# Patient Record
Sex: Female | Born: 1940
Health system: Southern US, Community
[De-identification: ages and names within clinical notes are randomized; demographics above are authoritative.]

## PROBLEM LIST (undated history)

## (undated) DIAGNOSIS — E785 Hyperlipidemia, unspecified: Secondary | ICD-10-CM

## (undated) DIAGNOSIS — T7840XA Allergy, unspecified, initial encounter: Secondary | ICD-10-CM

## (undated) DIAGNOSIS — M1990 Unspecified osteoarthritis, unspecified site: Secondary | ICD-10-CM

## (undated) HISTORY — PX: COLONOSCOPY: SHX174

## (undated) HISTORY — PX: TUBAL LIGATION: SHX77

## (undated) HISTORY — PX: UPPER GI ENDOSCOPY: SHX6162

## (undated) HISTORY — DX: Allergy, unspecified, initial encounter: T78.40XA

## (undated) HISTORY — PX: BREAST CYST ASPIRATION: SHX578

## (undated) HISTORY — PX: TONSILLECTOMY: SUR1361

---

## 1999-08-02 ENCOUNTER — Encounter: Payer: Self-pay | Admitting: Family Medicine

## 1999-08-02 ENCOUNTER — Encounter: Admission: RE | Admit: 1999-08-02 | Discharge: 1999-08-02 | Payer: Self-pay | Admitting: Family Medicine

## 2000-07-02 ENCOUNTER — Other Ambulatory Visit: Admission: RE | Admit: 2000-07-02 | Discharge: 2000-07-02 | Payer: Self-pay | Admitting: Family Medicine

## 2000-08-02 ENCOUNTER — Encounter: Admission: RE | Admit: 2000-08-02 | Discharge: 2000-08-02 | Payer: Self-pay | Admitting: Family Medicine

## 2000-08-02 ENCOUNTER — Encounter: Payer: Self-pay | Admitting: Family Medicine

## 2000-10-05 ENCOUNTER — Encounter: Payer: Self-pay | Admitting: Family Medicine

## 2000-10-05 ENCOUNTER — Encounter: Admission: RE | Admit: 2000-10-05 | Discharge: 2000-10-05 | Payer: Self-pay | Admitting: Family Medicine

## 2000-11-07 ENCOUNTER — Ambulatory Visit (HOSPITAL_COMMUNITY): Admission: RE | Admit: 2000-11-07 | Discharge: 2000-11-07 | Payer: Self-pay | Admitting: Gastroenterology

## 2000-11-07 ENCOUNTER — Encounter (INDEPENDENT_AMBULATORY_CARE_PROVIDER_SITE_OTHER): Payer: Self-pay | Admitting: Specialist

## 2002-01-20 ENCOUNTER — Encounter: Payer: Self-pay | Admitting: Gastroenterology

## 2002-01-20 ENCOUNTER — Encounter: Admission: RE | Admit: 2002-01-20 | Discharge: 2002-01-20 | Payer: Self-pay | Admitting: Gastroenterology

## 2002-02-06 ENCOUNTER — Encounter: Admission: RE | Admit: 2002-02-06 | Discharge: 2002-02-06 | Payer: Self-pay | Admitting: Gastroenterology

## 2002-02-06 ENCOUNTER — Encounter: Payer: Self-pay | Admitting: Gastroenterology

## 2002-03-13 ENCOUNTER — Encounter: Payer: Self-pay | Admitting: Gastroenterology

## 2002-03-13 ENCOUNTER — Ambulatory Visit (HOSPITAL_COMMUNITY): Admission: RE | Admit: 2002-03-13 | Discharge: 2002-03-13 | Payer: Self-pay | Admitting: Gastroenterology

## 2002-07-09 ENCOUNTER — Ambulatory Visit (HOSPITAL_COMMUNITY): Admission: RE | Admit: 2002-07-09 | Discharge: 2002-07-09 | Payer: Self-pay | Admitting: Gastroenterology

## 2002-08-15 ENCOUNTER — Encounter: Payer: Self-pay | Admitting: Family Medicine

## 2002-08-15 ENCOUNTER — Encounter: Admission: RE | Admit: 2002-08-15 | Discharge: 2002-08-15 | Payer: Self-pay | Admitting: Family Medicine

## 2002-09-26 ENCOUNTER — Encounter: Payer: Self-pay | Admitting: Family Medicine

## 2002-09-26 ENCOUNTER — Encounter: Admission: RE | Admit: 2002-09-26 | Discharge: 2002-09-26 | Payer: Self-pay | Admitting: Family Medicine

## 2003-05-13 ENCOUNTER — Encounter (INDEPENDENT_AMBULATORY_CARE_PROVIDER_SITE_OTHER): Payer: Self-pay | Admitting: *Deleted

## 2003-05-13 ENCOUNTER — Ambulatory Visit (HOSPITAL_COMMUNITY): Admission: RE | Admit: 2003-05-13 | Discharge: 2003-05-13 | Payer: Self-pay | Admitting: Gastroenterology

## 2003-10-20 ENCOUNTER — Encounter: Admission: RE | Admit: 2003-10-20 | Discharge: 2003-10-20 | Payer: Self-pay | Admitting: Family Medicine

## 2004-09-22 ENCOUNTER — Other Ambulatory Visit: Admission: RE | Admit: 2004-09-22 | Discharge: 2004-09-22 | Payer: Self-pay | Admitting: Family Medicine

## 2004-10-25 ENCOUNTER — Encounter: Admission: RE | Admit: 2004-10-25 | Discharge: 2004-10-25 | Payer: Self-pay | Admitting: Family Medicine

## 2005-11-06 ENCOUNTER — Encounter: Admission: RE | Admit: 2005-11-06 | Discharge: 2005-11-06 | Payer: Self-pay | Admitting: Family Medicine

## 2005-12-05 ENCOUNTER — Other Ambulatory Visit: Admission: RE | Admit: 2005-12-05 | Discharge: 2005-12-05 | Payer: Self-pay | Admitting: Family Medicine

## 2006-11-07 ENCOUNTER — Encounter: Admission: RE | Admit: 2006-11-07 | Discharge: 2006-11-07 | Payer: Self-pay | Admitting: Family Medicine

## 2006-12-07 ENCOUNTER — Other Ambulatory Visit: Admission: RE | Admit: 2006-12-07 | Discharge: 2006-12-07 | Payer: Self-pay | Admitting: Family Medicine

## 2007-10-09 ENCOUNTER — Encounter: Admission: RE | Admit: 2007-10-09 | Discharge: 2007-10-09 | Payer: Self-pay | Admitting: Family Medicine

## 2007-10-18 ENCOUNTER — Encounter: Admission: RE | Admit: 2007-10-18 | Discharge: 2007-10-18 | Payer: Self-pay | Admitting: Family Medicine

## 2007-11-13 ENCOUNTER — Encounter: Admission: RE | Admit: 2007-11-13 | Discharge: 2007-11-13 | Payer: Self-pay | Admitting: Family Medicine

## 2007-12-09 ENCOUNTER — Other Ambulatory Visit: Admission: RE | Admit: 2007-12-09 | Discharge: 2007-12-09 | Payer: Self-pay | Admitting: Family Medicine

## 2008-11-17 ENCOUNTER — Encounter: Admission: RE | Admit: 2008-11-17 | Discharge: 2008-11-17 | Payer: Self-pay | Admitting: Family Medicine

## 2009-11-25 ENCOUNTER — Encounter: Admission: RE | Admit: 2009-11-25 | Discharge: 2009-11-25 | Payer: Self-pay | Admitting: Family Medicine

## 2010-01-28 ENCOUNTER — Other Ambulatory Visit: Admission: RE | Admit: 2010-01-28 | Discharge: 2010-01-28 | Payer: Self-pay | Admitting: Family Medicine

## 2010-10-23 ENCOUNTER — Encounter: Payer: Self-pay | Admitting: Family Medicine

## 2010-12-05 ENCOUNTER — Other Ambulatory Visit: Payer: Self-pay | Admitting: Family Medicine

## 2010-12-05 DIAGNOSIS — Z1231 Encounter for screening mammogram for malignant neoplasm of breast: Secondary | ICD-10-CM

## 2010-12-07 ENCOUNTER — Ambulatory Visit
Admission: RE | Admit: 2010-12-07 | Discharge: 2010-12-07 | Disposition: A | Discharge status: Home / Self Care | Payer: 59 | Source: Ambulatory Visit | Attending: Family Medicine | Admitting: Family Medicine

## 2010-12-07 DIAGNOSIS — Z1231 Encounter for screening mammogram for malignant neoplasm of breast: Secondary | ICD-10-CM

## 2011-02-17 NOTE — Procedures (Signed)
[place]  Patient:    Samantha Joseph, Samantha Joseph                     MRN: [id number] Proc. Date: 11/07/00 Adm. Date:  [id number] Attending:  Nelda Marseille CC:         Gretta Arab. Valentina Lucks, M.D.   Procedure Report  PROCEDURE:  Esophagogastroduodenoscopy with biopsy.  ENDOSCOPIST:  Petra Kuba, M.D.  INDICATIONS:  Patient with recurrent episodes of abdominal pain questionably due to Helicobacter.  INFORMED CONSENT:  Consent was signed after risk, benefits, methods and options were thoroughly discussed in the office.  MEDICINES USED:  Demerol 50 mg, Versed 5 mg.  DESCRIPTION OF PROCEDURE:  The video endoscope was inserted by direct vision. The esophagus was normal.  The scope was inserted into the stomach where some minimal antritis and minimal gastritis along the lesser curve was seen. The scope passed easily through the normal pylorus into a normal duodenal bulb and around the C. loop to a normal second portion of the duodenum.  The scope was withdrawn back to the bulb and a good look there ruled out abnormalities in that location.  The scope was withdrawn back to the stomach and retroflexed. Cardiac, angularis, fundus, lesser and greater curve were all normal except for the area of the lesser curve with some gastritis.  The scope was straightened and straight visualization in the stomach confirmed the above.  The scope was then advanced to the antrum in the lesser curve and multiple biopsies were obtained of those areas of gastritis as well as a few proximal gastric biopsies to rule out H. pylori.  Air was suctioned after the scope was reinserted into the duodenum to recheck that area and then the scope was slowly withdrawn through a normal esophagus.  Scope was withdrawn.  The patient tolerated the procedure well and there was no obvious immediate complication.  ENDOSCOPIC DIAGNOSIS: 1. Minimal antritis and lesser curve gastritis status  post biopsy. 2. Otherwise within normal limits EGD.  PLAN: 1. Await pathology, continue Prilosec. 2. See back p.r.n. or in 2-3 months. DD:  11/07/00 TD:  11/08/00 Job: [id number]

## 2011-02-17 NOTE — Op Note (Signed)
   NAME:  Samantha Joseph, Samantha Joseph                        ACCOUNT NO.:  [id number]   MEDICAL RECORD NO.:  [id number]                   PATIENT TYPE:  AMB   LOCATION:  ENDO                                 FACILITY:  [facility]   PHYSICIAN:  Anselmo Rod, M.D.               DATE OF BIRTH:  01-30-1941   DATE OF PROCEDURE:  05/13/2003  DATE OF DISCHARGE:                                 OPERATIVE REPORT   PROCEDURE:  Esophagogastroduodenoscopy with biopsy.   ENDOSCOPIST:  Charna Elizabeth, M.D.   INSTRUMENT USED:  Olympus video panendoscope.   INDICATIONS FOR PROCEDURE:  70 year old white female with a history of  ongoing epigastric pain and chest pain with a history of previous H. pylori  infections in the past, rule out peptic ulcer disease, esophagitis,  gastritis, etc.   PREPROCEDURE PREPARATION:  Informed consent was obtained from the patient.  The patient was fasted for eight hours prior to the procedure.   PREPROCEDURE PHYSICAL:  Patient with stable vital signs.  Neck supple.  Chest clear to auscultation.  S1 and S2 regular.  Abdomen soft with normal  bowel sounds.   DESCRIPTION OF PROCEDURE:  The patient was placed in the left lateral  decubitus position, sedated with 40 mg of Demerol and 4 mg Versed  intravenously.  Once the patient was adequately sedated, maintained on low  flow oxygen, continuous cardiac monitoring, the Olympus video panendoscope  was advanced through the mouth piece over the tongue into the esophagus  under direct vision.  The entire esophagus appeared normal with no evidence  of ring, strictures, masses, esophagitis, or Barrett's mucosa.  The scope  was then advanced into the stomach.  There was moderate diffuse gastritis.  No ulcers, erosions, varices or polyps were seen.  Antral biopsies were done  to rule out H. pylori as etiology.  The duodenum, bulb, and small bowel  appeared normal.  There was no ulcer.  The patient tolerated the procedure  well without  complications.   IMPRESSION:  1. Moderate diffuse gastritis, no ulcer, erosions, masses, or polyps seen.  2. Normal appearing esophagus and proximal small bowel.    RECOMMENDATIONS:  1. Continue PPIs.  2. Treat with antibiotics if H. pylori is present.  3. Outpatient follow up in the next two weeks with further recommendations.                                               Anselmo Rod, M.D.    JNM/MEDQ  D:  05/13/2003  T:  05/13/2003  Job:  [id number]   cc:   Gretta Arab. Valentina Lucks, M.D.  [address] [place]  [place] [address]  Fax: [phone]

## 2011-02-17 NOTE — Op Note (Signed)
   NAME:  Samantha Joseph, Samantha Joseph                        ACCOUNT NO.:  [id number]   MEDICAL RECORD NO.:  [id number]                   PATIENT TYPE:  AMB   LOCATION:  ENDO                                 FACILITY:  [facility]   PHYSICIAN:  Anselmo Rod, M.D.               DATE OF BIRTH:  [birth year]   DATE OF PROCEDURE:  07/09/2002  DATE OF DISCHARGE:                                 OPERATIVE REPORT   PROCEDURE PERFORMED:  Screening colonoscopy.   ENDOSCOPIST:  Anselmo Rod, M.D.   INSTRUMENT USED:  Pediatric adjustable Olympus colonoscope.   INDICATIONS FOR PROCEDURE:  70 year old white female underwent screening  colonoscopy because of a history of colon cancer and rule out colonic  polyps, masses, etc.   PROCEDURE PREPARATION:  Informed consent was procured from the patient.  The  patient was fasted for eight hours prior to the procedure and prepped with a  bottle of magnesium citrate and a gallon of NuLytely the night prior to  procedure.   PREPROCEDURE PHYSICAL:  VITAL SIGNS:  The patient had stable vital signs.  NECK:  Supple.  CHEST:  Clear to auscultation.  S1 and S2 regular.  ABDOMEN:  Soft with normal bowel sounds.   DESCRIPTION OF PROCEDURE:  The patient was placed in the left lateral  decubitus position, sedated with 50 mg of Demerol and 5 mg of Versed  intravenously.  Once the patient was adequately sedated and maintained on  low-flow oxygen and continuous cardiac monitoring, the Olympus video  colonoscope was advanced in the rectum to the cecum without difficulty.  Prominent internal hemorrhoids were seen on retroflexion.  Small external  hemorrhoids were seen on anal inspection.  The rest of the colonic mucosa up  to the cecum appeared normal with no evidence of diverticulosis, masses, or  polyps.  The appendicular orifice and ileocecal orifice were clearly  visualized and photographed.   IMPRESSION:  1. Prominent internal hemorrhoid.  2. Small external hemorrhoid.  3. Normal-appearing colon up to the cecum.   RECOMMENDATIONS:  1. Repeat colorectal cancer screening is recommended in the five years     considering the patient's family history of     colon cancer unless the patient develops any abnormal symptoms.  2. A high-fiber diet with regular fluid intake has been advocated.  3. Outpatient followup on a p.r.n. basis.                                               Anselmo Rod, M.D.    JNM/MEDQ  D:  07/09/2002  T:  07/09/2002  Job:  [id number]   cc:   Gretta Arab. Valentina Lucks, M.D.

## 2011-11-28 ENCOUNTER — Other Ambulatory Visit: Payer: Self-pay | Admitting: Family Medicine

## 2011-11-28 DIAGNOSIS — Z1231 Encounter for screening mammogram for malignant neoplasm of breast: Secondary | ICD-10-CM

## 2011-12-08 ENCOUNTER — Ambulatory Visit
Admission: RE | Admit: 2011-12-08 | Discharge: 2011-12-08 | Disposition: A | Discharge status: Home / Self Care | Payer: 59 | Source: Ambulatory Visit | Attending: Family Medicine | Admitting: Family Medicine

## 2011-12-08 DIAGNOSIS — Z1231 Encounter for screening mammogram for malignant neoplasm of breast: Secondary | ICD-10-CM

## 2012-12-11 ENCOUNTER — Other Ambulatory Visit: Payer: Self-pay

## 2012-12-11 DIAGNOSIS — Z1231 Encounter for screening mammogram for malignant neoplasm of breast: Secondary | ICD-10-CM

## 2012-12-20 ENCOUNTER — Ambulatory Visit
Admission: RE | Admit: 2012-12-20 | Discharge: 2012-12-20 | Disposition: A | Discharge status: Home / Self Care | Payer: 59 | Source: Ambulatory Visit

## 2012-12-20 DIAGNOSIS — Z1231 Encounter for screening mammogram for malignant neoplasm of breast: Secondary | ICD-10-CM

## 2013-05-29 ENCOUNTER — Other Ambulatory Visit: Payer: Self-pay | Admitting: Gastroenterology

## 2013-05-29 DIAGNOSIS — R1031 Right lower quadrant pain: Secondary | ICD-10-CM

## 2013-05-30 ENCOUNTER — Ambulatory Visit
Admission: RE | Admit: 2013-05-30 | Discharge: 2013-05-30 | Disposition: A | Discharge status: Home / Self Care | Payer: Medicare Other | Source: Ambulatory Visit | Attending: Gastroenterology | Admitting: Gastroenterology

## 2013-05-30 DIAGNOSIS — R1031 Right lower quadrant pain: Secondary | ICD-10-CM

## 2013-05-30 MED ORDER — IOHEXOL 300 MG/ML  SOLN
100.0000 mL | Freq: Once | INTRAMUSCULAR | Status: AC | PRN
  Administered 2013-05-30: 100 mL via INTRAVENOUS

## 2013-07-19 ENCOUNTER — Encounter (HOSPITAL_COMMUNITY): Payer: Self-pay | Admitting: Emergency Medicine

## 2013-07-19 ENCOUNTER — Ambulatory Visit (INDEPENDENT_AMBULATORY_CARE_PROVIDER_SITE_OTHER): Payer: Medicare Other | Admitting: Emergency Medicine

## 2013-07-19 ENCOUNTER — Other Ambulatory Visit: Payer: Self-pay

## 2013-07-19 ENCOUNTER — Emergency Department (HOSPITAL_COMMUNITY)
Admission: EM | Admit: 2013-07-19 | Discharge: 2013-07-19 | Disposition: A | Discharge status: Home / Self Care | Payer: 59 | Attending: Emergency Medicine | Admitting: Emergency Medicine

## 2013-07-19 VITALS — BP 122/64 | HR 91 | Temp 98.1°F | Resp 16

## 2013-07-19 DIAGNOSIS — R05 Cough: Secondary | ICD-10-CM

## 2013-07-19 DIAGNOSIS — T7840XA Allergy, unspecified, initial encounter: Secondary | ICD-10-CM

## 2013-07-19 DIAGNOSIS — Z91010 Allergy to peanuts: Secondary | ICD-10-CM | POA: Insufficient documentation

## 2013-07-19 DIAGNOSIS — Z79899 Other long term (current) drug therapy: Secondary | ICD-10-CM | POA: Insufficient documentation

## 2013-07-19 DIAGNOSIS — L509 Urticaria, unspecified: Secondary | ICD-10-CM | POA: Insufficient documentation

## 2013-07-19 DIAGNOSIS — R059 Cough, unspecified: Secondary | ICD-10-CM

## 2013-07-19 MED ORDER — METHYLPREDNISOLONE SODIUM SUCC 125 MG IJ SOLR: 125.0000 mg | Freq: Once | INTRAMUSCULAR | Status: DC

## 2013-07-19 MED ORDER — DIPHENHYDRAMINE HCL 50 MG/ML IJ SOLN: 25.0000 mg | Freq: Once | INTRAMUSCULAR | Status: AC

## 2013-07-19 MED ORDER — EPINEPHRINE 0.3 MG/0.3ML IJ SOAJ
0.3000 mg | Freq: Once | INTRAMUSCULAR | Status: AC
  Administered 2013-07-19 (×2): 0.3 mg via INTRAMUSCULAR

## 2013-07-19 MED ORDER — EPINEPHRINE 0.3 MG/0.3ML IJ SOAJ: 0.3000 mg | INTRAMUSCULAR | Status: DC | PRN

## 2013-07-19 MED ORDER — EPINEPHRINE 0.3 MG/0.3ML IJ SOAJ: 0.3000 mg | Freq: Once | INTRAMUSCULAR | Status: AC

## 2013-07-19 MED ORDER — EPINEPHRINE 0.3 MG/0.3ML IJ SOAJ: 0.3000 mg | Freq: Once | INTRAMUSCULAR | Status: DC

## 2013-07-19 MED ORDER — FAMOTIDINE 20 MG PO TABS: 20.0000 mg | ORAL_TABLET | Freq: Two times a day (BID) | ORAL | Status: DC

## 2013-07-19 MED ORDER — PREDNISONE 50 MG PO TABS: 50.0000 mg | ORAL_TABLET | Freq: Every day | ORAL | Status: DC

## 2013-07-19 MED ORDER — FAMOTIDINE IN NACL 20-0.9 MG/50ML-% IV SOLN
20.0000 mg | Freq: Once | INTRAVENOUS | Status: AC
  Administered 2013-07-19: 20 mg via INTRAVENOUS
  Filled 2013-07-19: qty 50

## 2013-07-19 NOTE — ED Notes (Signed)
To ED via [facility] from [facility]-- with allergic rxn to Hazelnuts after eating 2 nuts this am-- developed hives immediately, and scratchy throat-- went to Urgent Care-- received 3 rounds of Epi 0.3 IM, Benadryl 25 mg IV, Solu Medrol 125mg . On Arrival-- a/o x3, w/d-- hives present on trunk,  Fading according to patient.

## 2013-07-19 NOTE — ED Provider Notes (Signed)
CSN: [id number]     Arrival date & time 07/19/13  1256 History   First MD Initiated Contact with Patient 07/19/13 1257     Chief Complaint  Patient presents with  . allergic rxn    (Consider location/radiation/quality/duration/timing/severity/associated sxs/prior Treatment) HPI Patient presents emergency department following an allergic reaction that occurred earlier this morning.  The [facility]  Patient, states she ate 3 hazelnuts and then developed hives around the chest and neck area.  She states that her throat felt scratchy as well.  Patient denies chest pain, shortness of breath, wheezing, dizziness, blurred vision, nausea, vomiting, or syncope. History reviewed. No pertinent past medical history. Past Surgical History  Procedure Laterality Date  . Tonsillectomy     No family history on file. History  Substance Use Topics  . Smoking status: Never Smoker   . Smokeless tobacco: Not on file  . Alcohol Use: Yes     Comment: socially   OB History   Grav Para Term Preterm Abortions TAB SAB Ect Mult Living                 Review of Systems All other systems negative except as documented in the HPI. All pertinent positives and negatives as reviewed in the HPI. Allergies  Sulfa antibiotics  Home Medications   Current Outpatient Rx  Name  Route  Sig  Dispense  Refill  . raloxifene (EVISTA) 60 MG tablet   Oral   Take 60 mg by mouth daily.          BP 81/50  Pulse 54  Temp(Src) 97.7 F (36.5 C) (Oral)  Resp 16  Ht 5' (1.524 m)  Wt 100 lb (45.36 kg)  BMI 19.53 kg/m2  SpO2 97% Physical Exam  Nursing note and vitals reviewed. Constitutional: She is oriented to person, place, and time. She appears well-developed and well-nourished. No distress.  HENT:  Head: Normocephalic and atraumatic.  Mouth/Throat: Oropharynx is clear and moist.  Eyes: Pupils are equal, round, and reactive to light.  Neck: Normal range of  motion. Neck supple.  Patient has some erythema of the anterior, lateral and posterior neck bilaterally  Cardiovascular: Normal rate, regular rhythm and normal heart sounds.  Exam reveals no gallop and no friction rub.   No murmur heard. Pulmonary/Chest: Effort normal and breath sounds normal. No respiratory distress. She has no wheezes.  Neurological: She is alert and oriented to person, place, and time.  Skin: Skin is warm and dry.    ED Course  Procedures (including critical care time) Patient was rechecked at 2:15 and there is no further hives or redness of her neck or upper chest.  Patient is feeling better.  She states she normally has a heart rate in the low 50s.  Her blood pressures usually in the 90s to 80.  Patient, states she would like to go home.  Patient is feeling no symptoms at this time  Carlyle Dolly, PA-C 07/19/13 1501

## 2013-07-19 NOTE — Progress Notes (Addendum)
[facility] [address], [place] [address] [phone]  Date:  07/19/2013   Name:  Samantha Joseph   DOB:  [birth year]   MRN:  [id number]  PCP:  No primary provider on file.    Chief Complaint: Allergic Reaction   History of Present Illness:  Samantha Joseph is a 72 y.o. very pleasant female patient who presents with the following:  About an hour ago age a handfull of hazelnuts with her granola and promptly developed hives and a tightness in her throat.  She has no wheezing or shortness of breath but does have a new onset non productive cough.  She is handling her saliva.  She has no history of allergy to other nuts or food products.  She is allergic to sulfa drugs.  She has had no prior treatment.  No nausea or vomiting. No stool change.  No improvement with over the counter medications or other home remedies. Denies other complaint or health concern today.  There are no active problems to display for this patient.   History reviewed. No pertinent past medical history.  History reviewed. No pertinent past surgical history.  History  Substance Use Topics  . Smoking status: Never Smoker   . Smokeless tobacco: Not on file  . Alcohol Use: Not on file    History reviewed. No pertinent family history.  Allergies  Allergen Reactions  . Sulfa Antibiotics Rash    Medication list has been reviewed and updated.  No current outpatient prescriptions on file prior to visit.   No current facility-administered medications on file prior to visit.    Review of Systems:  As per HPI, otherwise negative.    Physical Examination: Filed Vitals:   07/19/13 1145  BP: 122/64  Pulse: 91  Temp: 98.1 F (36.7 C)  Resp: 16   There were no vitals filed for this visit. There is no height or weight on file to calculate BMI. Ideal Body Weight:    GEN: WDWN, moderate distress, Non-toxic, A & O x 3 HEENT: Atraumatic, Normocephalic. Neck supple. No masses, No  LAD. Ears and Nose: No external deformity. CV: RRR, No M/G/R. No JVD. No thrill. No extra heart sounds. PULM: CTA B, no wheezes, crackles, rhonchi. No retractions. No resp. distress. No accessory muscle use. ABD: S, NT, ND, +BS. No rebound. No HSM. EXTR: No c/c/e NEURO Normal gait.  PSYCH: Normally interactive. Conversant. Not depressed or anxious appearing.  Calm demeanor.  Skin:  Generalized hives  Assessment and Plan: Allergic reaction to nuts IV EPI 0.4 twice Benadryl 25 Solumedrol 125 iv To hospital by ambulance  Signed,  Phillips Odor, MD   Patient had one dose of epinephrine and 10 minutes later a second was ordered following a repeat BP done by me was 135/90 as her cough and tight feeling in her throat increased as well as having some facial edema around her mouth and lips.  The facial edema improved with second dose of epi as well as her urticaria improved.  She at no time had chest pain, tightness or pressure.  Was transferred by EMS with an elevated BP

## 2013-07-19 NOTE — ED Notes (Signed)
Patient denies any dizziness or lightheadedness.  Patient is back to normal self before the allergic reaction

## 2013-07-20 NOTE — ED Provider Notes (Signed)
Medical screening examination/treatment/procedure(s) were performed by non-physician practitioner and as supervising physician I was immediately available for consultation/collaboration.  Latyra Jaye R. Corbin Falck, MD 07/20/13 0651 

## 2013-11-17 ENCOUNTER — Other Ambulatory Visit: Payer: Self-pay

## 2013-11-17 DIAGNOSIS — Z1231 Encounter for screening mammogram for malignant neoplasm of breast: Secondary | ICD-10-CM

## 2013-12-22 ENCOUNTER — Ambulatory Visit
Admission: RE | Admit: 2013-12-22 | Discharge: 2013-12-22 | Disposition: A | Discharge status: Home / Self Care | Payer: 59 | Source: Ambulatory Visit

## 2013-12-22 DIAGNOSIS — Z1231 Encounter for screening mammogram for malignant neoplasm of breast: Secondary | ICD-10-CM

## 2014-08-31 ENCOUNTER — Other Ambulatory Visit: Payer: Self-pay | Admitting: Physician Assistant

## 2014-08-31 ENCOUNTER — Ambulatory Visit
Admission: RE | Admit: 2014-08-31 | Discharge: 2014-08-31 | Disposition: A | Discharge status: Home / Self Care | Payer: Medicare HMO | Source: Ambulatory Visit | Attending: Physician Assistant | Admitting: Physician Assistant

## 2014-08-31 DIAGNOSIS — M79672 Pain in left foot: Secondary | ICD-10-CM

## 2014-09-16 ENCOUNTER — Ambulatory Visit (INDEPENDENT_AMBULATORY_CARE_PROVIDER_SITE_OTHER): Payer: 59 | Admitting: Podiatry

## 2014-09-16 ENCOUNTER — Ambulatory Visit (INDEPENDENT_AMBULATORY_CARE_PROVIDER_SITE_OTHER): Payer: Medicare HMO

## 2014-09-16 DIAGNOSIS — M722 Plantar fascial fibromatosis: Secondary | ICD-10-CM

## 2014-09-16 DIAGNOSIS — M205X1 Other deformities of toe(s) (acquired), right foot: Secondary | ICD-10-CM | POA: Diagnosis not present

## 2014-09-16 MED ORDER — TRIAMCINOLONE ACETONIDE 10 MG/ML IJ SUSP
10.0000 mg | Freq: Once | INTRAMUSCULAR | Status: AC
  Administered 2014-09-16: 10 mg

## 2014-09-16 NOTE — Progress Notes (Signed)
   Subjective:    Patient ID: Samantha RiegerNancy J Sirmon, female    DOB: [birth year], 73 y.o.   MRN: [id number]  HPI Comments: "I have pain in the heel"  Patient c/o aching plantar heel right for few months. Worsened on Thanksgiving. AM pain. PCP Rx'd prednisone x 6 days. Tried stretching, ice and heat-no help. She is an active runner but hasn't run in about 3 weeks.  Foot Pain      Review of Systems  Hematological: Bruises/bleeds easily.  All other systems reviewed and are negative.      Objective:   Physical Exam        Assessment & Plan:

## 2014-09-16 NOTE — Progress Notes (Signed)
Subjective:     Patient ID: Samantha RiegerNancy J Joseph, female   DOB: 1941/06/05, 73 y.o.   MRN: [id number]  HPI patient states I have had pain in my right heel for several months and it's worsened recently and also I do get restriction of motion and my big toe joint right   Review of Systems  All other systems reviewed and are negative.      Objective:   Physical Exam  Constitutional: She is oriented to person, place, and time.  Cardiovascular: Intact distal pulses.   Musculoskeletal: Normal range of motion.  Neurological: She is oriented to person, place, and time.  Skin: Skin is warm.  Nursing note and vitals reviewed.  neurovascular status intact with muscle strength adequate range of motion within normal limits subtalar midtarsal joint area patient's noted to have exquisite discomfort in the right plantar heel at the insertion of the tendon into the calcaneus and does have moderate reduction range of motion with mild crepitus first MPJ right. Is noted to have good digital perfusion and is well oriented 3     Assessment:     Plantar fasciitis acute nature right heel along with moderate structural hallux limitus condition right    Plan:     H&P and x-rays reviewed both conditions discussed. Injected the right plantar fascia 3 mg Kenalog 5 g Xylocaine Marcaine and went ahead and dispensed fascially brace with instructions. Patient was appraised of long-term orthotics which we'll discussed at next visit and I gave instructions on physical therapy

## 2014-09-16 NOTE — Patient Instructions (Signed)

## 2014-09-23 ENCOUNTER — Encounter: Payer: Self-pay | Admitting: Podiatry

## 2014-09-23 ENCOUNTER — Ambulatory Visit (INDEPENDENT_AMBULATORY_CARE_PROVIDER_SITE_OTHER): Payer: Medicare HMO | Admitting: Podiatry

## 2014-09-23 VITALS — BP 118/71 | HR 60 | Resp 12

## 2014-09-23 DIAGNOSIS — M205X1 Other deformities of toe(s) (acquired), right foot: Secondary | ICD-10-CM

## 2014-09-23 DIAGNOSIS — M722 Plantar fascial fibromatosis: Secondary | ICD-10-CM | POA: Diagnosis not present

## 2014-09-23 NOTE — Progress Notes (Signed)
Subjective:     Patient ID: Samantha RiegerNancy J Shaikh, female   DOB: 26-Apr-1941, 73 y.o.   MRN: [id number]  HPI patient presents stating I'm here for my heel that feels somewhat better but still get sore if I been on it for a while   Review of Systems     Objective:   Physical Exam Neurovascular status is intact with diminished fat pad noted plantar aspect right heel and moderate discomfort upon palpation but quite a bit improved from previous visit    Assessment:     Improving from plantar fasciitis right heel at the insertional point to the calcaneus with atrophied fat pad and moderate diminishment of the arch is part of the process and problem    Plan:     Reviewed condition and recommended orthotics which she was scanned for today. Discussed not going barefoot wearing supportive shoe gear and reappoint to recheck

## 2014-10-08 ENCOUNTER — Ambulatory Visit (INDEPENDENT_AMBULATORY_CARE_PROVIDER_SITE_OTHER): Payer: Medicare HMO | Admitting: Sports Medicine

## 2014-10-08 ENCOUNTER — Encounter: Payer: Self-pay | Admitting: Sports Medicine

## 2014-10-08 VITALS — BP 131/85 | HR 54 | Ht 60.0 in | Wt 100.0 lb

## 2014-10-08 DIAGNOSIS — M629 Disorder of muscle, unspecified: Secondary | ICD-10-CM | POA: Diagnosis not present

## 2014-10-08 DIAGNOSIS — S93699A Other sprain of unspecified foot, initial encounter: Secondary | ICD-10-CM

## 2014-10-08 DIAGNOSIS — M2021 Hallux rigidus, right foot: Secondary | ICD-10-CM

## 2014-10-08 NOTE — Progress Notes (Signed)
  Samantha Joseph - 74 y.o. female MRN [id number]  Date of birth: [birth year]  CC: Right Heel Pain Patient reports 4-5 month history of worsening right heel pain. Exquisite pain upon awakening on Thanksgiving morning. Has been progressive since that time. Now having pain throughout the day with prolonged standing or walking. She is an avid runner but has not run since Thanksgiving. She has been seen by podiatry and has had a plantar fascial injection without significant and prudent. She's been performing plantar fascial stretches. No other therapeutic exercises. She's been measured for custom orthotics however has not picked these up. No other temporary inserts. She does not recall any specific acute incident and no specific changes to her training regimen.  ROS:  Per HPI.   HISTORY: Past Medical, Surgical, Social, and Family History Reviewed & Updated per EMR.  Pertinent Historical Findings include: Otherwise relatively healthy, on Zocor and Prevacid Nonsmoker [employer] member  Historical Data Reviewed: [facility] x-[facility] of the right foot on 09/16/2014: Overall normal-appearing no acute fracture dislocation, no significant heel spur.  OBJECTIVE:  VS:   HT:5' (152.4 cm)   WT:100 lb (45.36 kg)  BMI:19.6          BP:131/85 mmHg  HR:(!) 54bpm  TEMP: ( )  RESP:   PHYSICAL EXAM: GENERAL:  adult Caucasian female. In no discomfort; no respiratory distress   PSYCH: alert and appropriate, good insight   NEURO: sensation is intact to light touch in the bilateral lower extremities   VASCULAR:  DP pulses 2+/4.  No significant edema.    BILATERAL FOOT EXAM: Appearance: Right foot with mild forefoot valgus Longitudinal Arch: moderate bilateral Transverse Arch: lateral column breakdown bilaterally with splay toe of 3/4 & 4/5 Calcaneous position with weight bearing: neutral  Skin: No overlying erythema/ecchymosis. Small resolving echymotic area over medial calcaneous 2o/2 prior  injection  Palpation: TTP over: Right medial insertion of PF  No TTP over: right calcaneous, retrocalcaneal bursa, Achilles tendon Metatarsal Squeeze Test: negative  Strength & ROM: 5/5 Strength with plantar flexion, dorsiflexion, inversion, eversion. and full active  Ankle dorsiflexion: Right = 90o; Left = 95o      Limited MSK Ultrasound of bilateral plantar fascia: Findings: Right: No significant thickening measures <0.4cm - there is a hypoechoic change just distal to the origin of the PF at the medial aspect of the calcaneus. No significant neovascularity or increased upper flow.  Left: Overall normal-appearing, measures <0.4 cm.   Impression: The above findings are consistent with partial rupture of the right plantar fascia with no significant evidence of chronic thickening.       ASSESSMENT: 1. Plantar fascia rupture   2. Hallux rigidus of right foot    Partial rupture of the plantar fascia; there is no significant chronic thickening which is inconsistent with chronic degenerative process and this may reflect an acute injury to the patient was unaware of.  PLAN: See problem based charting & AVS for additional documentation.  Small scaphoid pads with additional trimming placed in dress shoes today.  Patient will bring by tennis shoes for small scaphoid pads placement on current insoles with additional first ray post for her hallux limitus.  Compressive arch strapping provided today  HEP: Continue plantar fascial stretching as well as [place] exercises for eccentric heel drops. > Return in about 6 weeks (around 11/19/2014).    > Consider custom cushioned orthotics with additional first ray post

## 2014-10-08 NOTE — Patient Instructions (Addendum)
Ice, ice and ice! Work your wear up to [employer] sets of exercises 3Xs per day  Bring running shoes and [place].

## 2014-10-15 ENCOUNTER — Encounter: Payer: Self-pay | Admitting: Podiatry

## 2014-10-15 ENCOUNTER — Ambulatory Visit (INDEPENDENT_AMBULATORY_CARE_PROVIDER_SITE_OTHER): Payer: 59 | Admitting: Podiatry

## 2014-10-15 VITALS — BP 131/85 | HR 54 | Resp 16

## 2014-10-15 DIAGNOSIS — M722 Plantar fascial fibromatosis: Secondary | ICD-10-CM

## 2014-10-15 NOTE — Patient Instructions (Signed)

## 2014-10-15 NOTE — Progress Notes (Signed)
Subjective:     Patient ID: Samantha RiegerNancy J Mellinger, female   DOB: 04-Sep-1941, 74 y.o.   MRN: [id number]  HPI patient states the pain is still present but it has improved and occasionally I have no pain but at times with activities it still can be quite sore   Review of Systems     Objective:   Physical Exam Neurovascular status intact with muscle strength adequate and range of motion within normal limits. Patient's well oriented and is noted to have mild to moderate discomfort plantar aspect right heel that has improved from previous visit    Assessment:     Improved plantar fasciitis right with pain still present    Plan:     Orthotics dispensed and spent a great deal of time going over physical therapy exercise levels and shoe gear modifications. Reappoint to recheck in 6 weeks

## 2014-11-12 ENCOUNTER — Encounter: Payer: Self-pay | Admitting: Sports Medicine

## 2014-11-12 ENCOUNTER — Ambulatory Visit (INDEPENDENT_AMBULATORY_CARE_PROVIDER_SITE_OTHER): Payer: 59 | Admitting: Sports Medicine

## 2014-11-12 VITALS — Ht 60.0 in | Wt 100.0 lb

## 2014-11-12 DIAGNOSIS — M629 Disorder of muscle, unspecified: Secondary | ICD-10-CM

## 2014-11-12 DIAGNOSIS — M2021 Hallux rigidus, right foot: Secondary | ICD-10-CM

## 2014-11-12 DIAGNOSIS — M25571 Pain in right ankle and joints of right foot: Secondary | ICD-10-CM

## 2014-11-12 DIAGNOSIS — S93699A Other sprain of unspecified foot, initial encounter: Secondary | ICD-10-CM

## 2014-11-12 NOTE — Progress Notes (Signed)
  Samantha Joseph - 74 y.o. female MRN [id number]  Date of birth: 11/18/1940  CC: Right Heel Pain, follow up Patient was having mild improvement until 2 days ago when stepping out of the car she felt a pop in her right heel that was similar to the initial injury she previously experienced surrounding scaling. Extreme pain in inability to or following this. It has slowly gotten better over the past 2 days she is here for routine follow-up. She has been using the compressive arch strapping has been using small scaphoid pads provided at last visit and all of her daily shoes. She has started using custom orthotics in her running shoes provided by podiatry. She denies any significant bruising or ecchymosis. She is status post plantar fascial cortisone injection. Previously documented partial interfascial rupture.  ROS:  Per HPI.   HISTORY: Past Medical, Surgical, Social, and Family History Reviewed & Updated per EMR.  Pertinent Historical Findings include: Otherwise relatively healthy, on Zocor and Prevacid Nonsmoker [employer] [employer] member  Historical Data Reviewed: [facility] of the right foot on 09/16/2014: Overall normal-appearing no acute fracture dislocation, no significant heel spur.  OBJECTIVE:  VS:   HT:5' (152.4 cm)   WT:100 lb (45.36 kg)  BMI:19.6          BP:   HR: bpm  TEMP: ( )  RESP:   PHYSICAL EXAM: GENERAL:  adult Caucasian female. In no discomfort; no respiratory distress   PSYCH: alert and appropriate, good insight   NEURO: sensation is intact to light touch in the bilateral lower extremities   VASCULAR:  DP pulses 2+/4.  No significant edema.    BILATERAL FOOT EXAM: Appearance: Right foot with mild forefoot valgus Longitudinal Arch: moderate bilateral Transverse Arch: lateral column breakdown bilaterally with splay toe of 3/4 & 4/5 Calcaneous position with weight bearing: neutral  Skin: No overlying erythema/ecchymosis. Small resolving echymotic area over  medial calcaneous 2o/2 prior injection  Palpation: TTP over: Marked tenderness at right medial insertion of PF  No TTP over: right calcaneous, retrocalcaneal bursa, Achilles tendon Metatarsal Squeeze Test: negative  Strength & ROM: 5/5 Strength with plantar flexion, dorsiflexion, inversion, eversion. and full active  Ankle dorsiflexion: Right = 90o; Left = 95o      Limited MSK Ultrasound of bilateral plantar fascia: Findings: Right: Markedly thickened compared to last time. She is around 0.6 cm with hypoechoic change and heterogeneity with fluid tracking distally. Mid long arch is normal appearing.   Impression: The above findings are consistent with worsening secondary partial rupture of the right plantar fascia with chronic thickening.       ASSESSMENT: 1. Right ankle pain   2. Plantar fascia rupture   3. Hallux rigidus of right foot    Recurrent, worsening Partial rupture of the plantar fascia.  PLAN: See problem based charting & AVS for additional documentation.  Gentle exercises only at this time to allow this to calm down.  Body Helix full ankle compression sleeve provided today to help with swelling and comfort.. Discussed using during activity as well as for the first 2 hours following activity and PRN.Small scaphoid pads with additional trimming placed in dress shoes today.  Continue gentle range of motion exercises only. Restart gentle stretching and 3-4 days. Once pain subsides okay to start [place] exercises.  > Return in about 6 weeks (around 12/24/2014) for repeat ultrasound.

## 2014-11-13 ENCOUNTER — Other Ambulatory Visit: Payer: Self-pay

## 2014-11-13 DIAGNOSIS — Z1231 Encounter for screening mammogram for malignant neoplasm of breast: Secondary | ICD-10-CM

## 2014-11-26 ENCOUNTER — Ambulatory Visit: Payer: Medicare HMO | Admitting: Podiatry

## 2014-12-05 ENCOUNTER — Ambulatory Visit (INDEPENDENT_AMBULATORY_CARE_PROVIDER_SITE_OTHER): Payer: 59 | Admitting: Family Medicine

## 2014-12-05 VITALS — BP 134/80 | HR 67 | Temp 98.3°F | Resp 18 | Ht 60.0 in | Wt 102.0 lb

## 2014-12-05 DIAGNOSIS — T783XXA Angioneurotic edema, initial encounter: Secondary | ICD-10-CM

## 2014-12-05 MED ORDER — CETIRIZINE HCL 10 MG PO TABS: 10.0000 mg | ORAL_TABLET | Freq: Once | ORAL | Status: AC

## 2014-12-05 MED ORDER — METHYLPREDNISOLONE SODIUM SUCC 125 MG IJ SOLR: 125.0000 mg | Freq: Once | INTRAMUSCULAR | Status: AC

## 2014-12-05 MED ORDER — EPINEPHRINE 0.3 MG/0.3ML IJ SOAJ: 0.3000 mg | Freq: Once | INTRAMUSCULAR | Status: AC

## 2014-12-05 MED ORDER — RANITIDINE HCL 150 MG PO TABS: 150.0000 mg | ORAL_TABLET | Freq: Once | ORAL | Status: AC

## 2014-12-05 MED ORDER — METHYLPREDNISOLONE SODIUM SUCC 125 MG IJ SOLR
125.0000 mg | Freq: Once | INTRAMUSCULAR | Status: AC
  Administered 2014-12-05: 125 mg via INTRAMUSCULAR

## 2014-12-05 MED ORDER — PREDNISONE 20 MG PO TABS: ORAL_TABLET | ORAL | Status: DC

## 2014-12-05 NOTE — Patient Instructions (Addendum)
Take one zyrtec and one zantac (OTC) daily for the next couple of weeks. You can also continue to use benadryl as needed but be cautious of sedation.  You got a shot of steroid today, and I would like you to take the oral steroid for the next week.   Keep your epipen on you at all times.  In case of emergency use the pen and call 911.  You should also chew 2 benadryl tablets if you have any worsening swelling.    I am not sure who you saw for your allergy testing in the past.  However, it would be easier for you to see this person again as they have your results. When you get home from your trip see if you can figure this out and schedule a follow-up.  Let me know if you need any help with this.

## 2014-12-05 NOTE — Progress Notes (Signed)
[facility] [address], [place] [id number] [phone]  Date:  12/05/2014   Name:  Samantha Joseph   DOB:  May 04, 1941   MRN:  [id number]  PCP:  Astrid DivineGRIFFIN,ELAINE COLLINS, MD    Chief Complaint: Allergic Reaction and Edema   History of Present Illness:  Samantha Joseph is a 74 y.o. very pleasant female patient who presents with the following:  She ate out last night.  She noted onset of angioedema really right away after eating.  She is not sure if she may have gotten a hazelnut again or other nut in her food- hazelnuts are what triggered her reaction in 2014.  She is not taking any other new medications and is not aware of any other new food or exposures.  She went home from the restaurant last night and went to bed.  This am around 0400 she awoke and noted that her lip was still swollen, she took a benadryl and went back to sleep  Her swelling is better now since she took the benadryl.  She is not having any difficulty breathing, but she does notice some swelling in her face and "throat."    She has 2 epipens with her at all times. She would like an rx for new pens today.   She is leaving for [place] tomorrow for a mission trip.   She is generally in very good health. 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    Patient Active Problem List   Diagnosis Date Noted  . Hallux rigidus of right foot 10/08/2014  . Plantar fascia rupture 10/08/2014    Past Medical History  Diagnosis Date  . Allergy     Past Surgical History  Procedure Laterality Date  . Tonsillectomy    . Tubal ligation      History  Substance Use Topics  . Smoking status: Never Smoker   . Smokeless tobacco: Not on file  . Alcohol Use: Yes     Comment: socially    Family History  Problem Relation Age of Onset  . Heart disease Father   . Hyperlipidemia Father   . Hypertension Father     Allergies  Allergen Reactions  .  Other     Hazelnuts  . Sulfa Antibiotics Rash    Medication list has been reviewed and updated.  Current Outpatient Prescriptions on File Prior to Visit  Medication Sig Dispense Refill  . Calcium Acetate, Phos Binder, (CALCIUM ACETATE PO) Take 1 capsule by mouth daily.    . fluticasone (FLONASE) 50 MCG/ACT nasal spray     . Omega-3 Fatty Acids (OMEGA 3 PO) Take 2 capsules by mouth daily.    . simvastatin (ZOCOR) 20 MG tablet Take 20 mg by mouth daily.    . timolol (TIMOPTIC) 0.5 % ophthalmic solution      Current Facility-Administered Medications on File Prior to Visit  Medication Dose Route Frequency Provider Last Rate Last Dose  . diphenhydrAMINE (BENADRYL) injection 25 mg  25 mg Intravenous Once Carmelina DaneJeffery S Anderson, MD      . EPINEPHrine (EPI-PEN) injection 0.3 mg  0.3 mg Subcutaneous Once Carmelina DaneJeffery S Anderson, MD      . methylPREDNISolone sodium succinate (SOLU-MEDROL) 125 mg/2 mL injection 125 mg  125 mg Intravenous Once Nelva NayHeather M Marte, PA-C        Review of Systems:  As per HPI- otherwise negative.   Physical  Examination: Filed Vitals:   12/05/14 0918  BP: 134/80  Pulse: 67  Temp: 98.3 F (36.8 C)  Resp: 18   Filed Vitals:   12/05/14 0918  Height: 5' (1.524 m)  Weight: 102 lb (46.267 kg)   Body mass index is 19.92 kg/(m^2). Ideal Body Weight: Weight in (lb) to have BMI = 25: 127.7  GEN: WDWN, NAD, Non-toxic, A & O x 3, looks well HEENT: Atraumatic, Normocephalic. Neck supple. No masses, No LAD.  She has minimal swelling of the let side of her upper and lower lips at this point.  No swelling of the palate.  Mo swelling of the tongue Ears and Nose: No external deformity. CV: RRR, No M/G/R. No JVD. No thrill. No extra heart sounds. PULM: CTA B, no wheezes, crackles, rhonchi. No retractions. No resp. distress. No accessory muscle use. EXTR: No c/c/e NEURO Normal gait.  PSYCH: Normally interactive. Conversant. Not depressed or anxious appearing.  Calm demeanor.    Treat with zantac and zyretec PO once, IM solumedrol   Assessment and Plan: Angioedema, initial encounter - Plan: methylPREDNISolone sodium succinate (SOLU-MEDROL) 125 mg/2 mL injection 125 mg, cetirizine (ZYRTEC) tablet 10 mg, ranitidine (ZANTAC) tablet 150 mg, methylPREDNISolone sodium succinate (SOLU-MEDROL) 125 mg/2 mL injection 125 mg, predniSONE (DELTASONE) 20 MG tablet, EPINEPHrine 0.3 mg/0.3 mL IJ SOAJ injection  Here today with recurrent angioedema.  Her reaction is now over 11 hours old and is improving.  She has only mild swelling on her lips, none of her tongue, palate or oropharynx.   At this time do not feel that she needs ER care.  However cautioned her that at the first sign of more serious swelling she needs to take 2 benadryl and be prepared to use her epipen and call 911.  She understands, and will keep her epipen with her at all times  Called to check on her around 7:45 pm and she reports that her swelling continues to improve, is now almost resolved and that she is doing well  Signed Abbe Amsterdam, MD

## 2014-12-24 ENCOUNTER — Encounter: Payer: Self-pay | Admitting: Sports Medicine

## 2014-12-24 ENCOUNTER — Ambulatory Visit (INDEPENDENT_AMBULATORY_CARE_PROVIDER_SITE_OTHER): Payer: 59 | Admitting: Sports Medicine

## 2014-12-24 VITALS — BP 141/76 | HR 67 | Ht 60.0 in | Wt 102.0 lb

## 2014-12-24 DIAGNOSIS — S93699A Other sprain of unspecified foot, initial encounter: Secondary | ICD-10-CM

## 2014-12-24 DIAGNOSIS — M629 Disorder of muscle, unspecified: Secondary | ICD-10-CM

## 2014-12-24 NOTE — Progress Notes (Signed)
Patient ID: Vernona RiegerNancy J Blitzer, female   DOB: [birth year], 74 y.o.   MRN: [id number]  Patient in f/u of PF and RT hallux rigidus  Since last visit patient has not gotten any worse and the pain in the mornings and most of the day is not severe Conversely she has not seen any improvement However, she did take a trip to [place] with lots of walking for 10 days and was able to tolerate this  On last visit she had a partial rupture in the tenderness has gotten less  She does find that the  orthotics we made her are very comfortable in her walking shoes  She uses an arch strap  Physical examination Thin female in no acute distress BP 141/76 mmHg  Pulse 67  Ht 5' (1.524 m)  Wt 102 lb (46.267 kg)  BMI 19.92 kg/m2  Examination of foot reveals no significant tenderness except to deep palpation at the medial insertion of the plantar fascia into the right heel There is some increased loss of the longitudinal arch on the right compared to the left The right great toe show some hallux rigidus and spurring  Ankle: No visible erythema or swelling. Range of motion is full in all directions. Strength is 5/5 in all directions. Stable lateral and medial ligaments; squeeze test and kleiger test unremarkable; Talar dome nontender; No pain at base of 5th MT; No tenderness over cuboid; No tenderness over N spot or navicular prominence No tenderness on posterior aspects of lateral and medial malleolus No sign of peroneal tendon subluxations; Negative tarsal tunnel tinel's Able to walk 4 steps.

## 2014-12-24 NOTE — Assessment & Plan Note (Signed)
I think she is about 4 months into her plantar fasciitis and this may be her normal course of healing  Continue exercises stretching and ice  Continue to use orthotics when possible  Walk, not too much pain or a limp and crosstraining on the bicycle  Recheck in 3 months and repeat her ultrasound

## 2014-12-25 ENCOUNTER — Ambulatory Visit
Admission: RE | Admit: 2014-12-25 | Discharge: 2014-12-25 | Disposition: A | Discharge status: Home / Self Care | Payer: 59 | Source: Ambulatory Visit

## 2014-12-25 DIAGNOSIS — Z1231 Encounter for screening mammogram for malignant neoplasm of breast: Secondary | ICD-10-CM

## 2015-03-23 ENCOUNTER — Encounter: Payer: Self-pay | Admitting: Sports Medicine

## 2015-03-23 ENCOUNTER — Ambulatory Visit (INDEPENDENT_AMBULATORY_CARE_PROVIDER_SITE_OTHER): Payer: 59 | Admitting: Sports Medicine

## 2015-03-23 ENCOUNTER — Ambulatory Visit
Admission: RE | Admit: 2015-03-23 | Discharge: 2015-03-23 | Disposition: A | Discharge status: Home / Self Care | Payer: 59 | Source: Ambulatory Visit | Attending: Sports Medicine | Admitting: Sports Medicine

## 2015-03-23 VITALS — BP 157/85 | Ht 60.0 in | Wt 104.0 lb

## 2015-03-23 DIAGNOSIS — M79671 Pain in right foot: Secondary | ICD-10-CM

## 2015-03-23 DIAGNOSIS — M84374A Stress fracture, right foot, initial encounter for fracture: Secondary | ICD-10-CM

## 2015-03-23 NOTE — Progress Notes (Signed)
   Subjective:    Patient ID: Samantha Joseph, female    DOB: 1940/12/26, 74 y.o.   MRN: [id number]  HPI [name] is a 74 year old female who presents with right foot pain. Onset was 10 days ago without any known acute injury. She describes that she was biking when she felt sudden sharp pain in the dorsum of the right foot. This was followed by swelling soon after. She denies any numbness or tingling. She denies any fevers or chills. Location is very localized to the dorsum of the second metatarsal. No bruising. Symptoms are worse with walking, which has caused her to walk with quite a limp. She is taking 2 Aleve twice a day. This helps somewhat. She does not recall any other sudden change in her activity routine. She has had bone density screening, which she says was normal recently. She takes calcium and vitamin D daily. She denies any history of fragility or stress fractures. No chronic steroid or anti-epileptic use.  She was previously being seen by Dr. Darrick Penna for treatment of right plantar fasciitis since last November. She wears a metatarsal arch strap and has had custom orthotics crafted for her. She does home plantar fascia stretching exercises. She also has some mild right hallux rigidus.   Past medical history, social history, medications, and allergies were reviewed and are up to date in the chart.  Review of Systems 7 point review of systems was performed and was otherwise negative unless noted in the history of present illness.     Objective:   Physical Exam BP 157/85 mmHg  Ht 5' (1.524 m)  Wt 104 lb (47.174 kg)  BMI 20.31 kg/m2 GEN: The patient is well-developed well-nourished female and in no acute distress.  She is awake alert and oriented x3. SKIN: warm and well-perfused, no rash  EXTR: No lower extremity edema or calf tenderness Neuro: Strength 5/5 globally. Sensation intact throughout. No focal deficits. Vasc: +2 bilateral distal pulses.  MSK: Examination of the right  foot reveals diffuse nonpitting edema located over the dorsum of the mid to forefoot. No swelling of the ankle joint. Full range of motion of the ankle and toes without pain. Good strength with resisted eversion. Negative calcaneal squeeze test. Minimal tenderness along the plantar fascia, which is grossly intact. Point of maximal tenderness is at the mid to distal second metatarsal on the dorsum. Positive Tinel's over the bone.  Limited musculoskeletal ultrasound: Long and short axis views were obtained of the right foot. In scanning the second metatarsal on the long axis there is an area of localized surrounding hypoechoic fluid and small cortical change suggestive of a possible early or acute stress fracture. There appears to be some increased Doppler flow around this region as well. The cortical disruption was unable to be seen on the transverse view, however the hypoechoic fluid was able to be visualized. Remainder of metatarsals and midfoot exam were unremarkable.     Assessment & Plan:  Please see problem based assessment and plan in the problem list.

## 2015-03-23 NOTE — Assessment & Plan Note (Signed)
Assessment for reversible risk factors was performed today. She appears to have had bone density screening that was evidently normal. Adequate calcium and vitamin D intake. No significant change in activity, other than altered gait due to plantar fasciitis. -Placed in postoperative shoe -May continue to cross train on a stationary bike if she is pain-free ambulating in the postop shoe. -Discussed working in extra strength Tylenol onset of frequent use of NSAIDs due to theoretical delay in bone healing -Plan for x-rays of the right foot to evaluate for possible bone cyst, evaluate for any associated fractures or injuries -Plan for follow-up in 3 weeks for repeat ultrasound and to assess healing. Call sooner if needed.

## 2015-04-15 ENCOUNTER — Ambulatory Visit (INDEPENDENT_AMBULATORY_CARE_PROVIDER_SITE_OTHER): Payer: 59 | Admitting: Sports Medicine

## 2015-04-15 ENCOUNTER — Encounter: Payer: Self-pay | Admitting: Sports Medicine

## 2015-04-15 VITALS — BP 118/86 | Ht 60.0 in | Wt 102.0 lb

## 2015-04-15 DIAGNOSIS — M84374D Stress fracture, right foot, subsequent encounter for fracture with routine healing: Secondary | ICD-10-CM

## 2015-04-15 NOTE — Progress Notes (Signed)
  Samantha Joseph - 74 y.o. female MRN [id number]  Date of birth: April 17, 1941  SUBJECTIVE:  Including CC & ROS.  CC: f/u on R. Foot   HPI: Patient presents to clinic for R. Foot follow-up after 2nd metacarpal stress fracture that was diagnosed about 3 weeks ago. Patient states she feels much improved. She denies any pain with ambulation. She no longer has to take any medications. She has been wearing her arch band and postoperative boot daily. She endorses some lose of ROM and tightness.    HISTORY: Past Medical, Surgical, Social, and Family History Reviewed & Updated per EMR.    DATA REVIEWED: Limited musculoskeletal ultrasound (7/14): Long and short axis views were obtained of the right foot. In scanning the second metatarsal there is an area of cortical disruption. Surrounding this area is hypoechoic fluid, increased doppler flow, and new cortical bone changes suggestive of stress fracture healing with good changes of hard and soft callus.  PHYSICAL EXAM:  BP 118/86 mmHg  Ht 5' (1.524 m)  Wt 102 lb (46.267 kg)  BMI 19.92 kg/m2  GEN: The patient is well-developed well-nourished female and in no acute distress. She is awake alert and oriented x3. SKIN: warm and well-perfused, no rash. Small area dime sized area consistent with stage 1 pressure ulcer over dorsum of foot.  EXTR: No lower extremity edema or calf tenderness Neuro: Strength 5/5 globally. Sensation intact throughout.  Vasc: +2 bilateral distal pulses.  MSK: Examination of the right foot reveals trace swelling over forefoot. No swelling of the ankle joint. Limited range of motion of the ankle and toes but without pain. Good strength. Negative calcaneal squeeze test. Negative tinel's over the bone. Non-tender of 2nd metatarsal.   ASSESSMENT & PLAN: A: R. Foot 2nd metatarsal stress fracture. Healing as noticed on US and physical exam. Now without pain or discomfort.   P: -Intermittent postoperative shoe use, but now able to wear  supportive shoes -Continue use of compression band on foot -Return to regular activities but avoid uneven surfaces -Continue Calcium and Vit. D supplements -RTC in 4 weeks for follow-up  Caryl AdaJazma Babs Dabbs, DO 04/15/2015, 3:51 PM PGY-2, [facility]  Agree with plan.  Sterling BigKB Fields, MD

## 2015-04-15 NOTE — Assessment & Plan Note (Signed)
Good hard and soft callus formation  She is healing on an expected scheduled  She should be cautious with this over the next 3 weeks and then increase her activity some I will recheck this at 4 weeks and repeat her ultrasound to see that healing is complete

## 2015-05-13 ENCOUNTER — Ambulatory Visit (INDEPENDENT_AMBULATORY_CARE_PROVIDER_SITE_OTHER): Payer: 59 | Admitting: Sports Medicine

## 2015-05-13 ENCOUNTER — Encounter: Payer: Self-pay | Admitting: Sports Medicine

## 2015-05-13 VITALS — BP 142/83 | HR 65 | Ht 60.0 in | Wt 102.0 lb

## 2015-05-13 DIAGNOSIS — M84374G Stress fracture, right foot, subsequent encounter for fracture with delayed healing: Secondary | ICD-10-CM

## 2015-05-13 NOTE — Progress Notes (Signed)
Patient ID: Samantha Joseph, female   DOB: [birth year], 74 y.o.   MRN: [id number]  Patient returns for a right second metatarsal fracture She does have osteoporosis and uses prolia injections She is now 8 weeks out and has used the boot most of the time but is now using shoes No real pain but she has not done much walking or weight-bearing exercise other than what she does with normal activity  Prior to the metatarsal injury she was having pain with plantar fasciitis That has also improved with there rest necessary to heal the fracture  Physical exam No acute distress BP 142/83 mmHg  Pulse 65  Ht 5' (1.524 m)  Wt 102 lb (46.267 kg)  BMI 19.92 kg/m2  There is no tenderness on palpating the dorsum of the foot No pain with squeezing the second metatarsal There is a small bump over the mid metatarsal shaft that is palpable  Ultrasound shows that there is hard callus over the second metatarsal There still is increased Doppler activity

## 2015-05-13 NOTE — Assessment & Plan Note (Signed)
Evaluation today suggests that this is 85% healed I suspected it slowly because she has osteoporosis  I suggested a gradual increase in activity More weight bearing Use arch strap Okay to use normal she is if no significant pain  Recheck with repeat scan in 4 weeks

## 2015-06-29 ENCOUNTER — Encounter: Payer: Self-pay | Admitting: Sports Medicine

## 2015-06-29 ENCOUNTER — Ambulatory Visit (INDEPENDENT_AMBULATORY_CARE_PROVIDER_SITE_OTHER): Payer: 59 | Admitting: Sports Medicine

## 2015-06-29 VITALS — BP 135/68 | Ht 60.0 in | Wt 105.0 lb

## 2015-06-29 DIAGNOSIS — M810 Age-related osteoporosis without current pathological fracture: Secondary | ICD-10-CM

## 2015-06-29 DIAGNOSIS — M84374D Stress fracture, right foot, subsequent encounter for fracture with routine healing: Secondary | ICD-10-CM | POA: Diagnosis not present

## 2015-06-29 NOTE — Progress Notes (Signed)
Patient ID: Samantha Joseph, female   DOB: [birth year], 74 y.o.   MRN: [id number]  First DX with MT 2 stress fx 03/14/15 This healed slowly but much better on [place] last visit Hx of osteoporosis on q 3 mos Prolia (has had 2 injections)  Now with lateral foot pain on RT Worred about new stress fx Hurts now on 4th MT area  No swelling  Only recent change in activity is that she did a lot of walking in [place]  Ros: no gen.arthritis/ no recnt infections/ No fever/ no redness/ no warmth  Exam NAD BP 135/68 mmHg  Ht 5' (1.524 m)  Wt 105 lb (47.628 kg)  BMI 20.51 kg/m2 Good callus palpated over MT 2 prox on RT Mild TTP of 4th MT proximal No swelling No redness MT 3 is non tender MT 5 is nontender  Foot shows some loss of long arch Mild pronation  US shows well healed callus over mT 2 MT 3/4 and 5 are normal No swelling No increase in doppler

## 2015-06-30 DIAGNOSIS — M810 Age-related osteoporosis without current pathological fracture: Secondary | ICD-10-CM | POA: Insufficient documentation

## 2015-06-30 NOTE — Assessment & Plan Note (Signed)
2nd MT appears well healed with hard callus  With MT pain on other MT primarily 4 but normal [place] I am suspicious for this being osteoporotic pain  Add a cushioned insole to all shoes  Use foot strap  Reck 6 wks or if worse

## 2015-06-30 NOTE — Assessment & Plan Note (Signed)
Increase calcium intake  Keep up Vit D  I am concerned she may be getting sxs related to OP and slow healing of MT stress fx  Cont on Prolia and not enough time of Tx to know effectiveness

## 2015-07-08 ENCOUNTER — Ambulatory Visit
Admission: RE | Admit: 2015-07-08 | Discharge: 2015-07-08 | Disposition: A | Discharge status: Home / Self Care | Payer: 59 | Source: Ambulatory Visit | Attending: Family Medicine | Admitting: Family Medicine

## 2015-07-08 ENCOUNTER — Other Ambulatory Visit: Payer: Self-pay | Admitting: Family Medicine

## 2015-07-08 DIAGNOSIS — M8430XA Stress fracture, unspecified site, initial encounter for fracture: Secondary | ICD-10-CM

## 2015-07-14 DIAGNOSIS — M84374D Stress fracture, right foot, subsequent encounter for fracture with routine healing: Secondary | ICD-10-CM | POA: Diagnosis not present

## 2015-10-19 DIAGNOSIS — R1013 Epigastric pain: Secondary | ICD-10-CM | POA: Diagnosis not present

## 2015-10-19 DIAGNOSIS — K219 Gastro-esophageal reflux disease without esophagitis: Secondary | ICD-10-CM | POA: Diagnosis not present

## 2015-10-19 DIAGNOSIS — R079 Chest pain, unspecified: Secondary | ICD-10-CM | POA: Diagnosis not present

## 2015-10-26 DIAGNOSIS — H401130 Primary open-angle glaucoma, bilateral, stage unspecified: Secondary | ICD-10-CM | POA: Diagnosis not present

## 2015-10-26 DIAGNOSIS — H43811 Vitreous degeneration, right eye: Secondary | ICD-10-CM | POA: Diagnosis not present

## 2015-10-26 DIAGNOSIS — H43812 Vitreous degeneration, left eye: Secondary | ICD-10-CM | POA: Diagnosis not present

## 2015-10-26 DIAGNOSIS — H353131 Nonexudative age-related macular degeneration, bilateral, early dry stage: Secondary | ICD-10-CM | POA: Diagnosis not present

## 2015-11-24 ENCOUNTER — Other Ambulatory Visit: Payer: Self-pay

## 2015-11-24 DIAGNOSIS — Z1231 Encounter for screening mammogram for malignant neoplasm of breast: Secondary | ICD-10-CM

## 2015-12-02 DIAGNOSIS — K219 Gastro-esophageal reflux disease without esophagitis: Secondary | ICD-10-CM | POA: Diagnosis not present

## 2015-12-02 DIAGNOSIS — A09 Infectious gastroenteritis and colitis, unspecified: Secondary | ICD-10-CM | POA: Diagnosis not present

## 2015-12-02 DIAGNOSIS — Z800 Family history of malignant neoplasm of digestive organs: Secondary | ICD-10-CM | POA: Diagnosis not present

## 2015-12-08 DIAGNOSIS — A09 Infectious gastroenteritis and colitis, unspecified: Secondary | ICD-10-CM | POA: Diagnosis not present

## 2015-12-27 ENCOUNTER — Ambulatory Visit
Admission: RE | Admit: 2015-12-27 | Discharge: 2015-12-27 | Disposition: A | Discharge status: Home / Self Care | Payer: 59 | Source: Ambulatory Visit

## 2015-12-27 DIAGNOSIS — Z1231 Encounter for screening mammogram for malignant neoplasm of breast: Secondary | ICD-10-CM

## 2016-03-07 DIAGNOSIS — M8580 Other specified disorders of bone density and structure, unspecified site: Secondary | ICD-10-CM | POA: Diagnosis not present

## 2016-03-22 IMAGING — CR DG FOOT COMPLETE 3+V*R*
3 series · 3 of 3 positions shown · non-contrast
Comparison: 09/16/2014

CLINICAL DATA: Right foot pain for 10 days, no known injury,
initial encounter

EXAM:
RIGHT FOOT COMPLETE - 3+ VIEW

[t foot ap right]
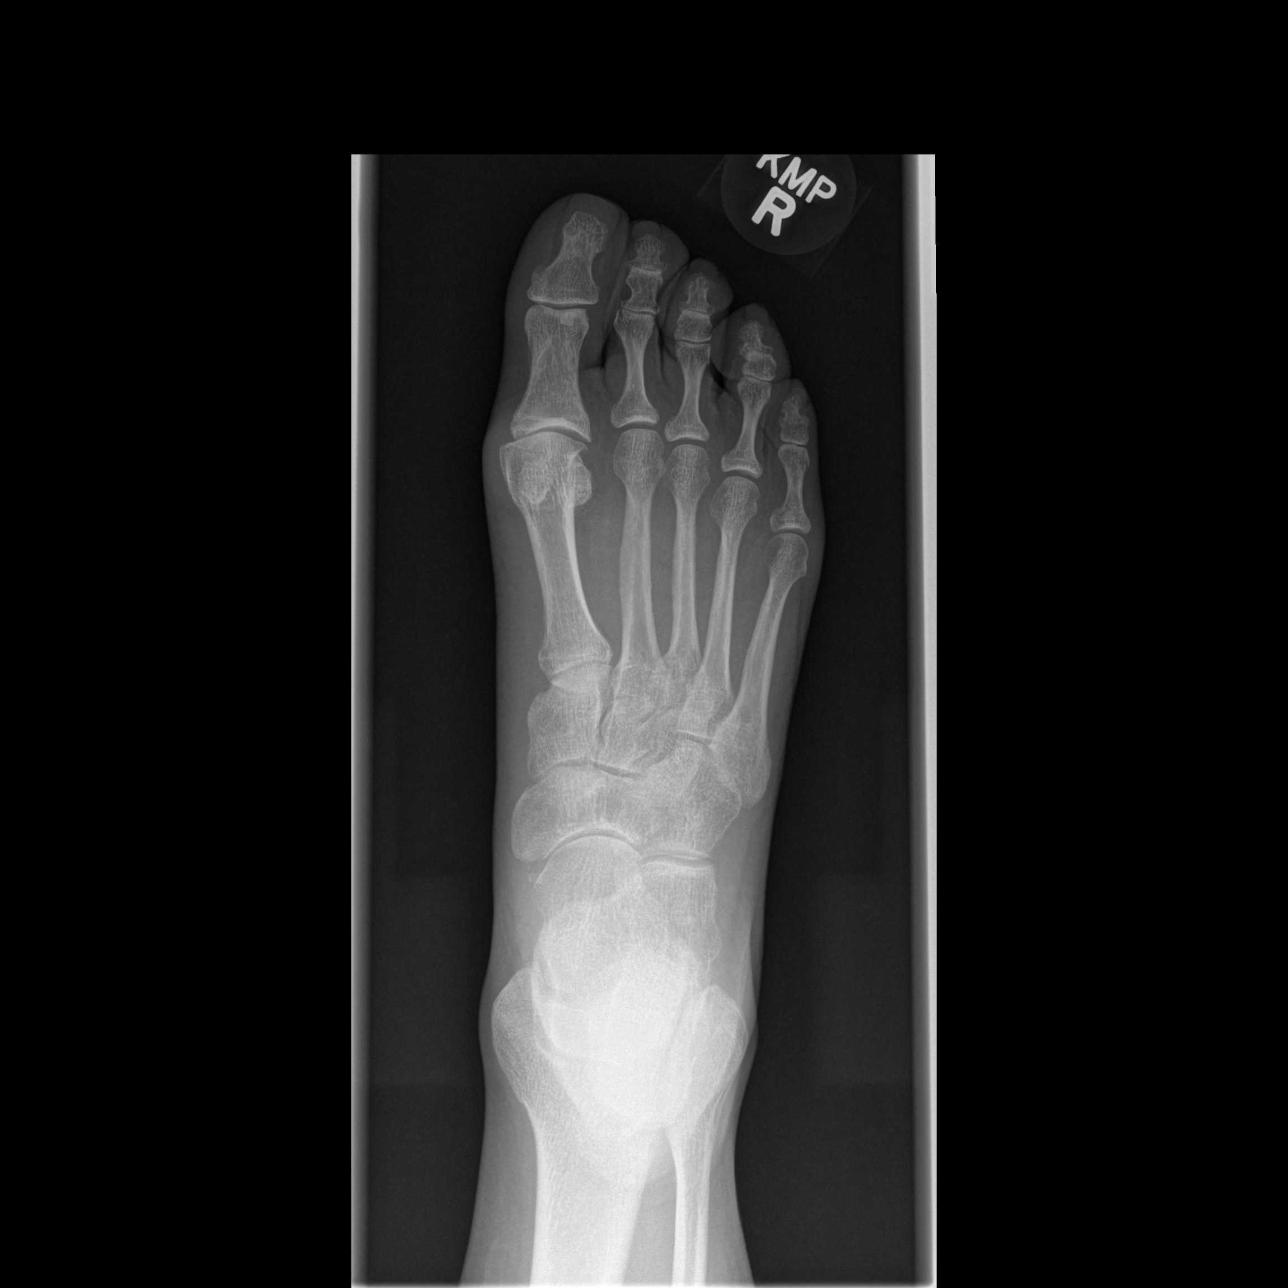

[t foot oblique right]
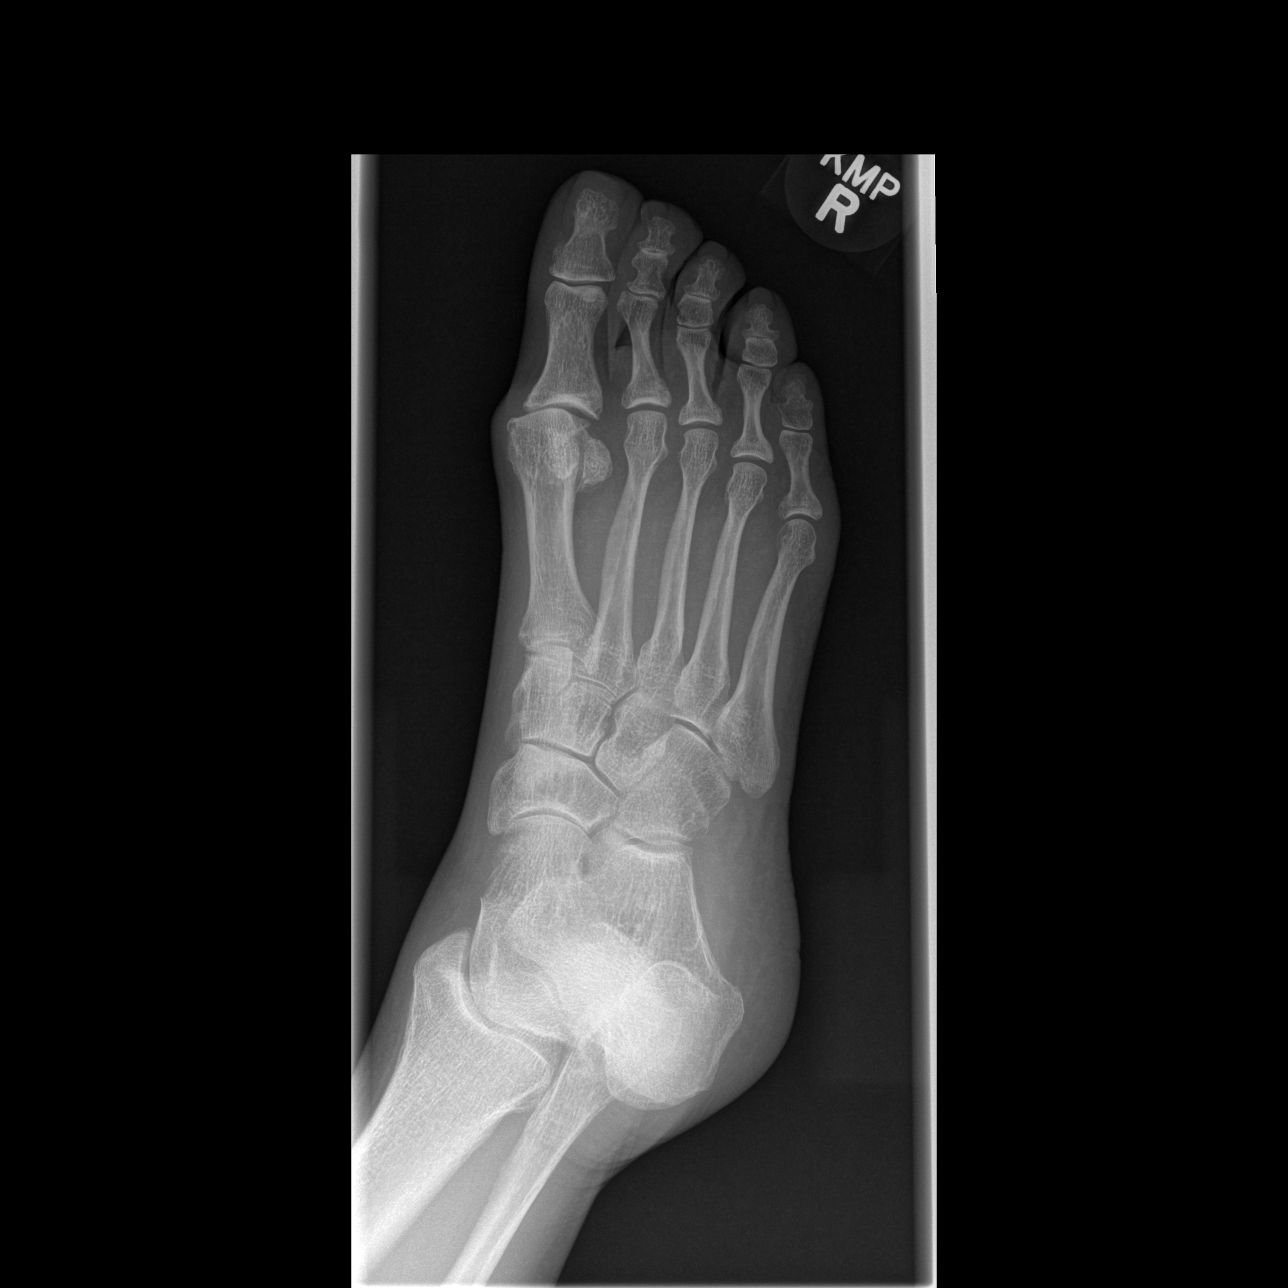

[t foot lat right]
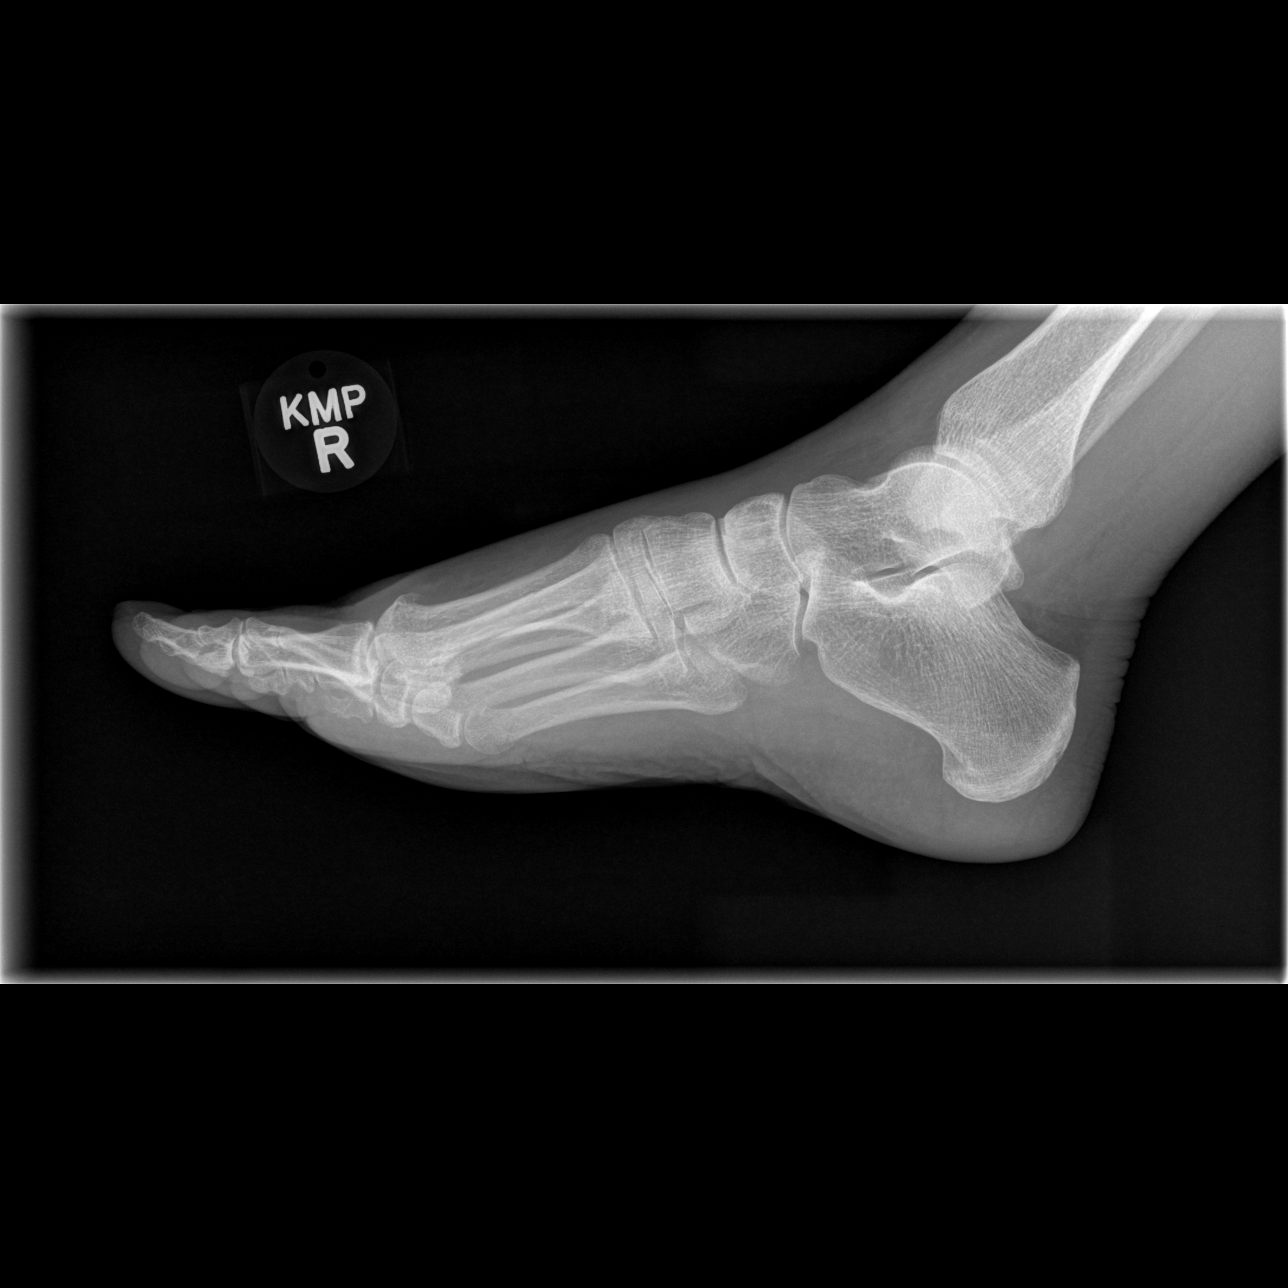

[3 of 3 positions shown; findings below may reference images not displayed]

FINDINGS: Mild degenerative changes of the first MTP joint are noted. No
definitive acute fracture or healing fracture is noted. No gross
soft tissue abnormality is seen.
IMPRESSION: No acute abnormality noted.

## 2016-04-11 DIAGNOSIS — M791 Myalgia: Secondary | ICD-10-CM | POA: Diagnosis not present

## 2016-04-11 DIAGNOSIS — E7800 Pure hypercholesterolemia, unspecified: Secondary | ICD-10-CM | POA: Diagnosis not present

## 2016-07-07 IMAGING — CR DG FOOT COMPLETE 3+V*R*
3 series · 3 of 3 positions shown · non-contrast
Comparison: 03/23/2015.

CLINICAL DATA: Stress fracture.

EXAM:
RIGHT FOOT COMPLETE - 3+ VIEW

[t foot ap right]
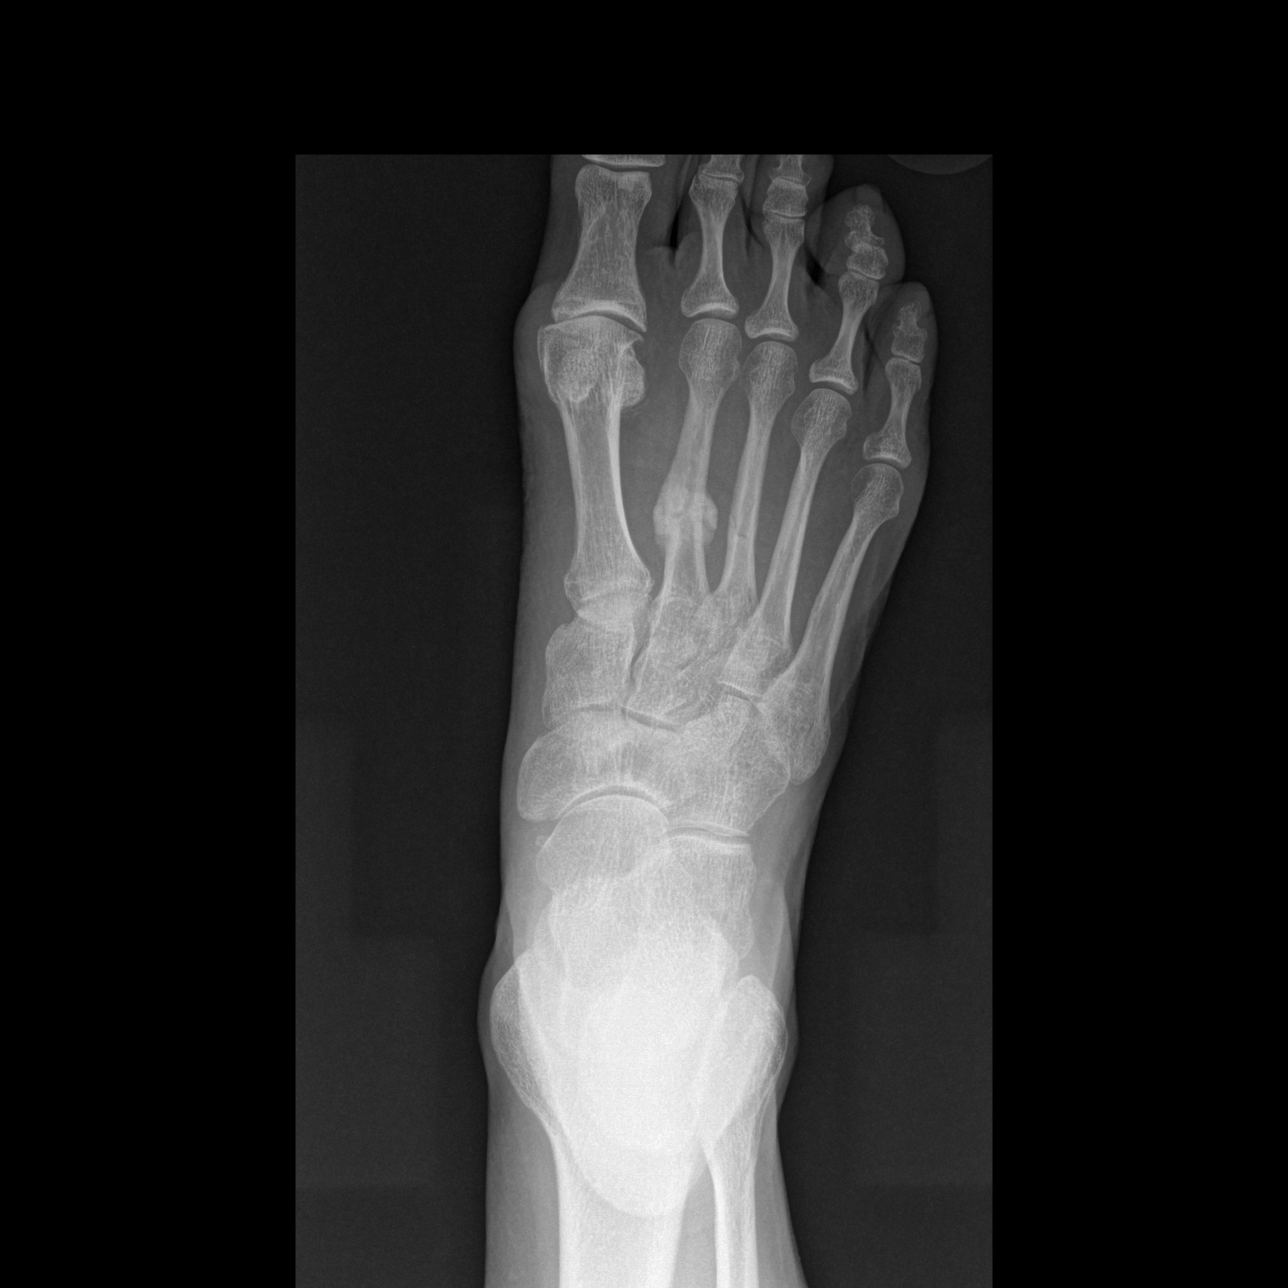

[t foot oblique right]
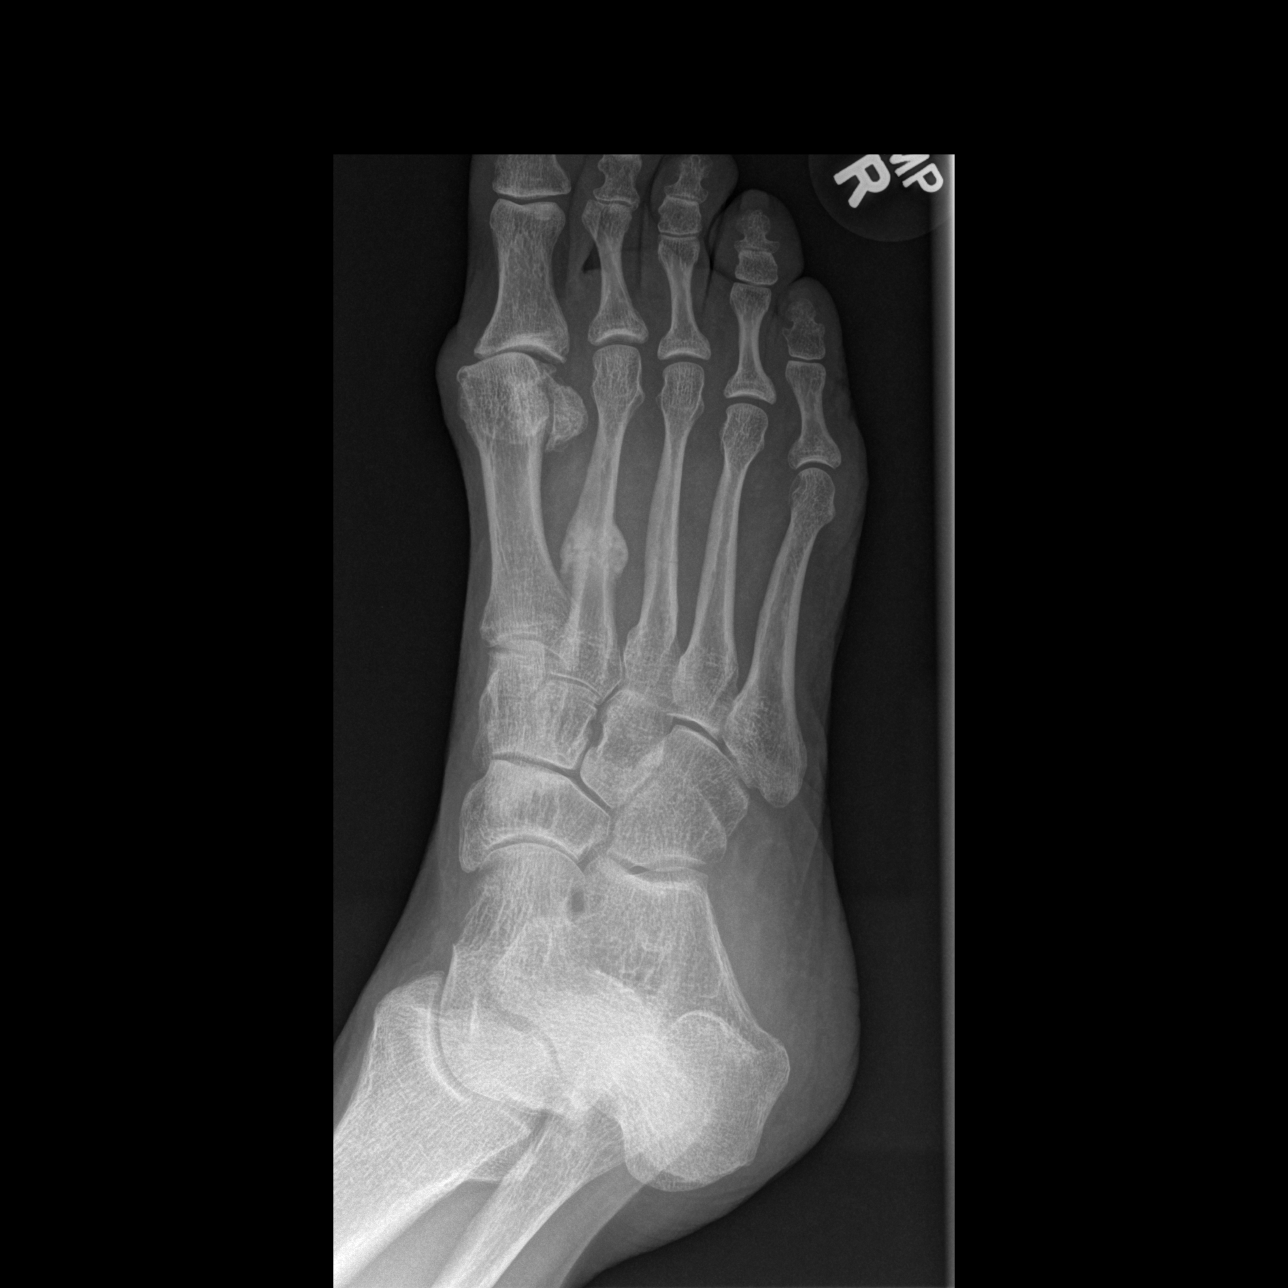

[t foot lat right]
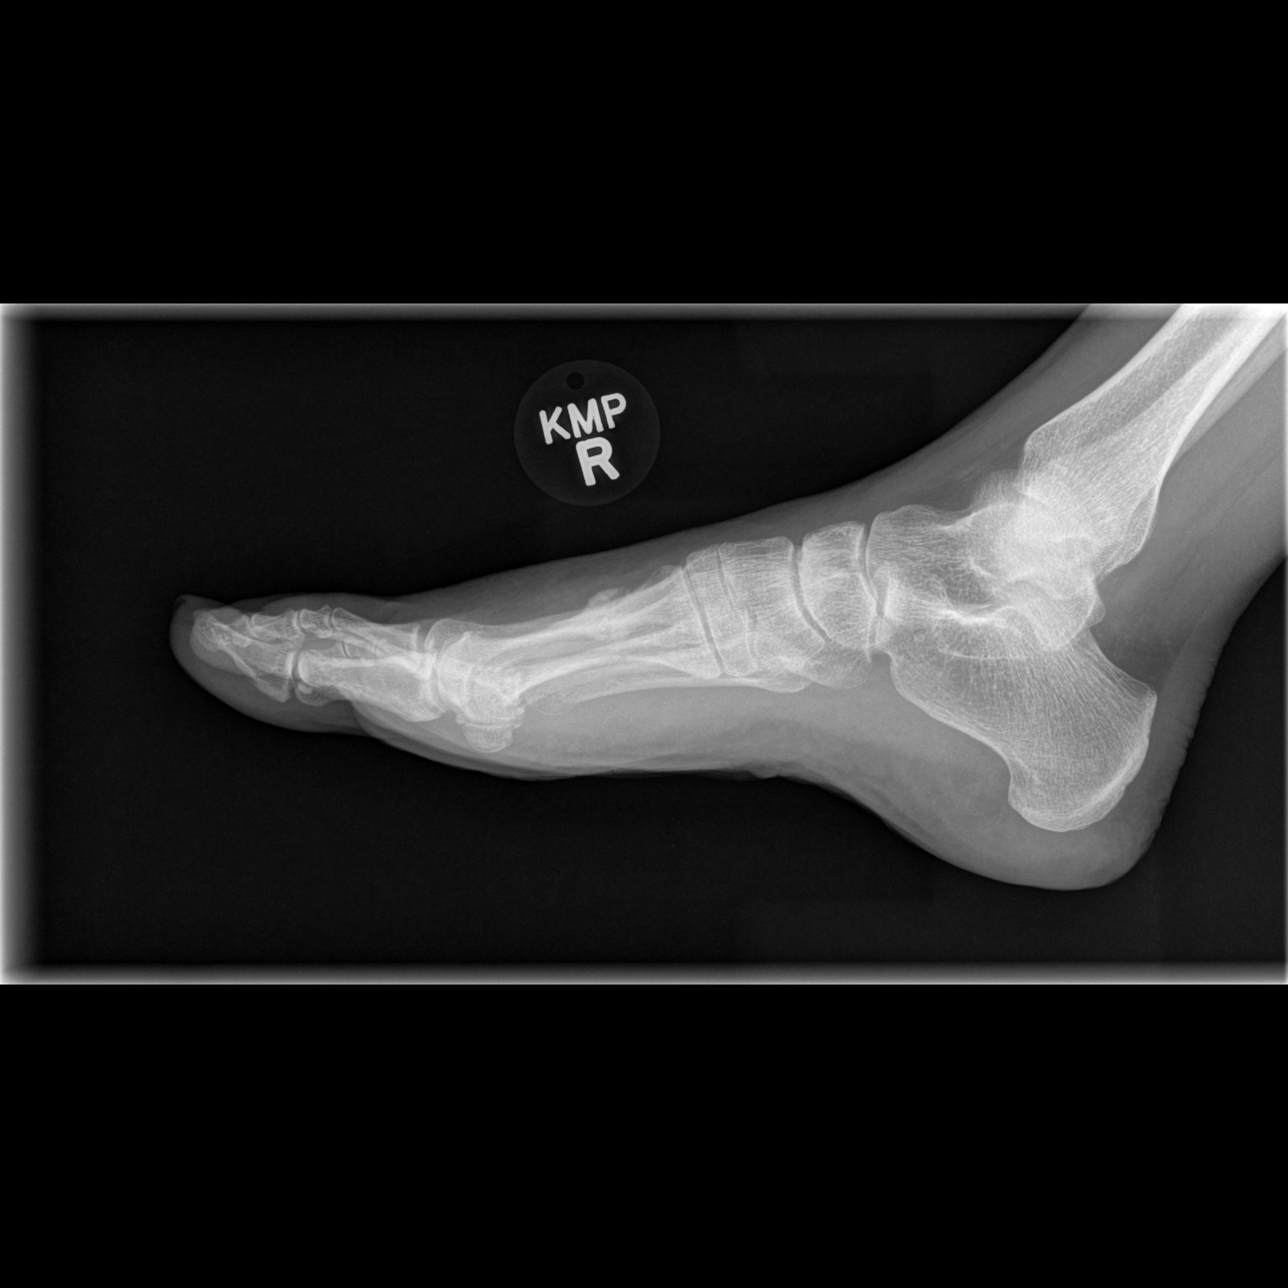

[3 of 3 positions shown; findings below may reference images not displayed]

FINDINGS: A fracture with prominent callus formation noted about the lower
portion the right second metatarsal. Mild angulation deformity. A
fracture is noted along the lower portion of the shaft of the right
third metatarsal. This is new. No callus formation is noted. Diffuse
degenerative change.
IMPRESSION: 1. Fracture with prominent callus formation along the lower portion
of the right second metatarsal. Mild angulation deformity.
2. Acute fracture noted along the lower portion of the right third
metatarsal. No callus formation noted about this fracture.

## 2016-07-25 ENCOUNTER — Other Ambulatory Visit: Payer: Self-pay | Admitting: Family Medicine

## 2016-07-25 MED ORDER — DOXYCYCLINE HYCLATE 100 MG PO TABS: 100.0000 mg | ORAL_TABLET | Freq: Two times a day (BID) | ORAL | 0 refills | Status: DC

## 2016-09-07 DIAGNOSIS — M8580 Other specified disorders of bone density and structure, unspecified site: Secondary | ICD-10-CM | POA: Diagnosis not present

## 2016-09-19 DIAGNOSIS — R1013 Epigastric pain: Secondary | ICD-10-CM | POA: Diagnosis not present

## 2016-09-19 DIAGNOSIS — R1033 Periumbilical pain: Secondary | ICD-10-CM | POA: Diagnosis not present

## 2016-09-19 DIAGNOSIS — K625 Hemorrhage of anus and rectum: Secondary | ICD-10-CM | POA: Diagnosis not present

## 2016-09-19 DIAGNOSIS — K219 Gastro-esophageal reflux disease without esophagitis: Secondary | ICD-10-CM | POA: Diagnosis not present

## 2016-09-19 DIAGNOSIS — Z800 Family history of malignant neoplasm of digestive organs: Secondary | ICD-10-CM | POA: Diagnosis not present

## 2016-09-20 DIAGNOSIS — K219 Gastro-esophageal reflux disease without esophagitis: Secondary | ICD-10-CM | POA: Diagnosis not present

## 2016-09-20 DIAGNOSIS — K449 Diaphragmatic hernia without obstruction or gangrene: Secondary | ICD-10-CM | POA: Diagnosis not present

## 2016-09-20 DIAGNOSIS — K625 Hemorrhage of anus and rectum: Secondary | ICD-10-CM | POA: Diagnosis not present

## 2016-09-20 DIAGNOSIS — K2970 Gastritis, unspecified, without bleeding: Secondary | ICD-10-CM | POA: Diagnosis not present

## 2016-10-09 DIAGNOSIS — K635 Polyp of colon: Secondary | ICD-10-CM | POA: Diagnosis not present

## 2016-10-09 DIAGNOSIS — Z1211 Encounter for screening for malignant neoplasm of colon: Secondary | ICD-10-CM | POA: Diagnosis not present

## 2016-10-26 DIAGNOSIS — H401130 Primary open-angle glaucoma, bilateral, stage unspecified: Secondary | ICD-10-CM | POA: Diagnosis not present

## 2016-10-26 DIAGNOSIS — H43812 Vitreous degeneration, left eye: Secondary | ICD-10-CM | POA: Diagnosis not present

## 2016-10-26 DIAGNOSIS — H43811 Vitreous degeneration, right eye: Secondary | ICD-10-CM | POA: Diagnosis not present

## 2016-10-26 DIAGNOSIS — H353131 Nonexudative age-related macular degeneration, bilateral, early dry stage: Secondary | ICD-10-CM | POA: Diagnosis not present

## 2016-10-27 DIAGNOSIS — L600 Ingrowing nail: Secondary | ICD-10-CM | POA: Diagnosis not present

## 2016-10-28 ENCOUNTER — Encounter (HOSPITAL_COMMUNITY): Payer: Self-pay | Admitting: *Deleted

## 2016-10-28 ENCOUNTER — Ambulatory Visit (HOSPITAL_COMMUNITY)
Admission: EM | Admit: 2016-10-28 | Discharge: 2016-10-28 | Disposition: A | Discharge status: Home / Self Care | Payer: 59 | Attending: Family Medicine | Admitting: Family Medicine

## 2016-10-28 DIAGNOSIS — L600 Ingrowing nail: Secondary | ICD-10-CM | POA: Diagnosis not present

## 2016-10-28 NOTE — ED Provider Notes (Signed)
[facility]    CSN: [id number] Arrival date & time: 10/28/16  1318     History   Chief Complaint Chief Complaint  Patient presents with  . Toe Pain    HPI Samantha Joseph is a 76 y.o. female.   This is a 76 yo woman with 3 months of left great toe pain, treated unsuccessfully with antibiotics twice.  She was seen at [facility] primary care yesterday after being up all night with pain the night before, but didn't sleep last night either after starting another round of antibiotics.  The medial edge of the cuticle is swollen with apparent pus pocket.  Patient works at [employer] and is planning a brief trip to [place] Monday.      Past Medical History:  Diagnosis Date  . Allergy     Patient Active Problem List   Diagnosis Date Noted  . Osteoporosis 06/30/2015  . Metatarsal stress fracture of right foot 03/23/2015  . Angioedema 12/05/2014  . Hallux rigidus of right foot 10/08/2014  . Plantar fascia rupture 10/08/2014    Past Surgical History:  Procedure Laterality Date  . TONSILLECTOMY    . TUBAL LIGATION      OB History    No data available       Home Medications    Prior to Admission medications   Medication Sig Start Date End Date Taking? Authorizing Provider  Calcium Acetate, Phos Binder, (CALCIUM ACETATE PO) Take 1 capsule by mouth daily.    Historical Provider, MD  doxycycline (VIBRA-TABS) 100 MG tablet Take 1 tablet (100 mg total) by mouth 2 (two) times daily. 07/25/16   Elvina Sidle, MD  EPINEPHrine 0.3 mg/0.3 mL IJ SOAJ injection Inject 0.3 mLs (0.3 mg total) into the muscle once. Use as needed for allergic reaction emergency 12/05/14   Pearline Cables, MD  fluticasone [facility]) 50 MCG/ACT nasal spray  11/07/14   Historical Provider, MD  Omega-3 Fatty Acids (OMEGA 3 PO) Take 2 capsules by mouth daily.    Historical Provider, MD  simvastatin (ZOCOR) 20 MG tablet Take 20 mg by mouth daily.    Historical Provider, MD  timolol (TIMOPTIC)  0.5 % ophthalmic solution  11/07/14   Historical Provider, MD    Family History Family History  Problem Relation Age of Onset  . Heart disease Father   . Hyperlipidemia Father   . Hypertension Father     Social History Social History  Substance Use Topics  . Smoking status: Never Smoker  . Smokeless tobacco: Not on file  . Alcohol use Yes     Comment: socially     Allergies   Other and Sulfa antibiotics   Review of Systems Review of Systems  Constitutional: Negative.   HENT: Negative.   Respiratory: Negative.   Cardiovascular: Negative.   Gastrointestinal: Negative.   Musculoskeletal: Positive for gait problem and joint swelling.  Neurological: Negative for dizziness.     Physical Exam Triage Vital Signs ED Triage Vitals  Enc Vitals Group     BP 10/28/16 1401 140/80     Pulse Rate 10/28/16 1401 80     Resp --      Temp 10/28/16 1401 98.3 F (36.8 C)     Temp Source 10/28/16 1401 Oral     SpO2 10/28/16 1401 100 %     Weight --      Height --      Head Circumference --      Peak Flow --  Pain Score 10/28/16 1400 8     Pain Loc --      Pain Edu? --      Excl. in GC? --    No data found.   Updated Vital Signs BP 140/80 (BP Location: Right Arm)   Pulse 80   Temp 98.3 F (36.8 C) (Oral)   SpO2 100%       Physical Exam  Constitutional: She is oriented to person, place, and time. She appears well-developed and well-nourished.  HENT:  Right Ear: External ear normal.  Left Ear: External ear normal.  Mouth/Throat: Oropharynx is clear and moist.  Eyes: Conjunctivae are normal. Pupils are equal, round, and reactive to light.  Neck: Normal range of motion. Neck supple.  Pulmonary/Chest: Effort normal.  Musculoskeletal: Normal range of motion.  Large medial paronychia left great toe.  Neurological: She is alert and oriented to person, place, and time.  Skin: Skin is warm and dry.  Nursing note and vitals reviewed.    UC Treatments / Results    Labs (all labs ordered are listed, but only abnormal results are displayed) Labs Reviewed - No data to display  EKG  EKG Interpretation None       Radiology No results found.  Procedures .Nail Removal Date/Time: 10/28/2016 2:21 PM Performed by: Elvina SidleLAUENSTEIN, Floretta Petro Authorized by: Elvina SidleLAUENSTEIN, Dastan Krider   Consent:    Consent obtained:  Verbal   Consent given by:  Patient   Risks discussed:  Infection and pain   Alternatives discussed:  No treatment Location:    Foot:  L big toe Pre-procedure details:    Skin preparation:  Betadine   Preparation: Patient was prepped and draped in the usual sterile fashion   Anesthesia (see MAR for exact dosages):    Anesthesia method:  Nerve block   Block anesthetic:  Lidocaine 1% w/o epi   Block outcome:  Anesthesia achieved Nail Removal:    Nail removed:  Partial   Nail bed repaired: no   Post-procedure details:    Dressing:  4x4 sterile gauze   Patient tolerance of procedure:  Tolerated well, no immediate complications   (including critical care time)  Medications Ordered in UC Medications - No data to display   Initial Impression / Assessment and Plan / UC Course  I have reviewed the triage vital signs and the nursing notes.  Pertinent labs & imaging results that were available during my care of the patient were reviewed by me and considered in my medical decision making (see chart for details).       Final Clinical Impressions(s) / UC Diagnoses   Final diagnoses:  Ingrown left big toenail    New Prescriptions New Prescriptions   No medications on file  doxycycline already prescribed yesterday to be continued along with soaking tonight   Elvina SidleKurt Jakarius Flamenco, MD 10/28/16 1425

## 2016-10-28 NOTE — ED Triage Notes (Signed)
Ingrown l  Big   Toe     fot    sev  Months     Pus  Present    Pain  Has  Been  To  The touch

## 2016-12-08 ENCOUNTER — Other Ambulatory Visit: Payer: Self-pay | Admitting: Family Medicine

## 2016-12-08 DIAGNOSIS — Z1231 Encounter for screening mammogram for malignant neoplasm of breast: Secondary | ICD-10-CM

## 2016-12-28 ENCOUNTER — Ambulatory Visit
Admission: RE | Admit: 2016-12-28 | Discharge: 2016-12-28 | Disposition: A | Discharge status: Home / Self Care | Payer: 59 | Source: Ambulatory Visit | Attending: Family Medicine | Admitting: Family Medicine

## 2016-12-28 DIAGNOSIS — Z1231 Encounter for screening mammogram for malignant neoplasm of breast: Secondary | ICD-10-CM

## 2017-01-26 DIAGNOSIS — M5137 Other intervertebral disc degeneration, lumbosacral region: Secondary | ICD-10-CM | POA: Diagnosis not present

## 2017-03-09 DIAGNOSIS — M8580 Other specified disorders of bone density and structure, unspecified site: Secondary | ICD-10-CM | POA: Diagnosis not present

## 2017-03-09 DIAGNOSIS — E7800 Pure hypercholesterolemia, unspecified: Secondary | ICD-10-CM | POA: Diagnosis not present

## 2017-03-19 DIAGNOSIS — M25552 Pain in left hip: Secondary | ICD-10-CM | POA: Diagnosis not present

## 2017-03-19 DIAGNOSIS — M545 Low back pain: Secondary | ICD-10-CM | POA: Diagnosis not present

## 2017-03-19 DIAGNOSIS — M8580 Other specified disorders of bone density and structure, unspecified site: Secondary | ICD-10-CM | POA: Diagnosis not present

## 2017-03-19 DIAGNOSIS — M5137 Other intervertebral disc degeneration, lumbosacral region: Secondary | ICD-10-CM | POA: Diagnosis not present

## 2017-03-28 DIAGNOSIS — M545 Low back pain: Secondary | ICD-10-CM | POA: Diagnosis not present

## 2017-03-28 DIAGNOSIS — M8580 Other specified disorders of bone density and structure, unspecified site: Secondary | ICD-10-CM | POA: Diagnosis not present

## 2017-03-28 DIAGNOSIS — M25552 Pain in left hip: Secondary | ICD-10-CM | POA: Diagnosis not present

## 2017-03-28 DIAGNOSIS — M5137 Other intervertebral disc degeneration, lumbosacral region: Secondary | ICD-10-CM | POA: Diagnosis not present

## 2017-04-06 ENCOUNTER — Ambulatory Visit (INDEPENDENT_AMBULATORY_CARE_PROVIDER_SITE_OTHER): Payer: 59 | Admitting: Podiatry

## 2017-04-06 ENCOUNTER — Encounter: Payer: Self-pay | Admitting: Podiatry

## 2017-04-06 DIAGNOSIS — L03032 Cellulitis of left toe: Secondary | ICD-10-CM

## 2017-04-06 NOTE — Patient Instructions (Signed)

## 2017-04-09 NOTE — Progress Notes (Signed)
Subjective:    Patient ID: Samantha Joseph, female   DOB: 76 y.o.   MRN: [id number]   HPI patient points to left big toenail stating that it's been red and draining for about the last year and she's tried to work on it and trim it herself and it's not working    ROS      Objective:  Physical Exam neurovascular status intact with negative Homans sign noted and patient noted to have redness and drainage in the lateral side of the left hallux with incurvation the nailbed pain and no proximal edema erythema or drainage noted     Assessment:   Localized paronychia infection left hallux chronic in nature      Plan:    H&P condition reviewed and recommended removal of the nail border cleaning up of abscess tissue and necrotic tissue and allowing channel for drainage and then permanent procedure to follow in about one month. Educated her on this and today I went ahead and infiltrated the left hallux 60 mg like Marcaine mixture remove the lateral border removed all proud flesh abscess tissue and applied sterile dressing. Begin soaks and reappoint for permanent procedure

## 2017-04-17 DIAGNOSIS — M545 Low back pain: Secondary | ICD-10-CM | POA: Diagnosis not present

## 2017-04-17 DIAGNOSIS — M47816 Spondylosis without myelopathy or radiculopathy, lumbar region: Secondary | ICD-10-CM | POA: Diagnosis not present

## 2017-04-17 DIAGNOSIS — Q7649 Other congenital malformations of spine, not associated with scoliosis: Secondary | ICD-10-CM | POA: Diagnosis not present

## 2017-04-17 DIAGNOSIS — M4155 Other secondary scoliosis, thoracolumbar region: Secondary | ICD-10-CM | POA: Diagnosis not present

## 2017-04-17 DIAGNOSIS — M5136 Other intervertebral disc degeneration, lumbar region: Secondary | ICD-10-CM | POA: Diagnosis not present

## 2017-04-17 DIAGNOSIS — R030 Elevated blood-pressure reading, without diagnosis of hypertension: Secondary | ICD-10-CM | POA: Diagnosis not present

## 2017-04-17 DIAGNOSIS — M4316 Spondylolisthesis, lumbar region: Secondary | ICD-10-CM | POA: Diagnosis not present

## 2017-04-17 DIAGNOSIS — M546 Pain in thoracic spine: Secondary | ICD-10-CM | POA: Diagnosis not present

## 2017-04-17 DIAGNOSIS — M549 Dorsalgia, unspecified: Secondary | ICD-10-CM | POA: Diagnosis not present

## 2017-04-25 DIAGNOSIS — H401131 Primary open-angle glaucoma, bilateral, mild stage: Secondary | ICD-10-CM | POA: Diagnosis not present

## 2017-04-25 DIAGNOSIS — H353131 Nonexudative age-related macular degeneration, bilateral, early dry stage: Secondary | ICD-10-CM | POA: Diagnosis not present

## 2017-04-25 DIAGNOSIS — H43811 Vitreous degeneration, right eye: Secondary | ICD-10-CM | POA: Diagnosis not present

## 2017-04-25 DIAGNOSIS — H401130 Primary open-angle glaucoma, bilateral, stage unspecified: Secondary | ICD-10-CM | POA: Diagnosis not present

## 2017-05-04 ENCOUNTER — Encounter: Payer: Self-pay | Admitting: Podiatry

## 2017-05-04 ENCOUNTER — Ambulatory Visit (INDEPENDENT_AMBULATORY_CARE_PROVIDER_SITE_OTHER): Payer: 59 | Admitting: Podiatry

## 2017-05-04 DIAGNOSIS — L600 Ingrowing nail: Secondary | ICD-10-CM

## 2017-05-04 NOTE — Progress Notes (Signed)
Subjective:    Patient ID: Samantha RiegerNancy J Tierce, female   DOB: 76 y.o.   MRN: [id number]   HPI patient states she's doing quite a bit better from paronychia excision left with like a permanent fixture she's had numerous trouble with this nail    ROS      Objective:  Physical Exam neurovascular status intact with patient's left hallux lateral border remains slightly red and incurvated with chronic ingrown toenail     Assessment:   Improved from paronychia infection left with ingrown toenail deformity left      Plan:    Recommended correction of deformity and explained procedure and today I went ahead and I infiltrated the left hallux 60 Milligan's like Marcaine mixture removed the lateral border removed the matrix and lunula and then exposed the tissue and applied chemical agent consisting of phenol 3 applications 30 seconds followed by alcohol lavaged sterile dressing. Gave instructions on soaks and reappoint

## 2017-05-04 NOTE — Patient Instructions (Addendum)

## 2017-05-17 DIAGNOSIS — Z800 Family history of malignant neoplasm of digestive organs: Secondary | ICD-10-CM | POA: Diagnosis not present

## 2017-05-17 DIAGNOSIS — Z86010 Personal history of colonic polyps: Secondary | ICD-10-CM | POA: Diagnosis not present

## 2017-05-17 DIAGNOSIS — K219 Gastro-esophageal reflux disease without esophagitis: Secondary | ICD-10-CM | POA: Diagnosis not present

## 2017-05-17 DIAGNOSIS — R194 Change in bowel habit: Secondary | ICD-10-CM | POA: Diagnosis not present

## 2017-05-25 DIAGNOSIS — M5136 Other intervertebral disc degeneration, lumbar region: Secondary | ICD-10-CM | POA: Diagnosis not present

## 2017-05-25 DIAGNOSIS — M4155 Other secondary scoliosis, thoracolumbar region: Secondary | ICD-10-CM | POA: Diagnosis not present

## 2017-05-25 DIAGNOSIS — M47816 Spondylosis without myelopathy or radiculopathy, lumbar region: Secondary | ICD-10-CM | POA: Diagnosis not present

## 2017-05-25 DIAGNOSIS — M4316 Spondylolisthesis, lumbar region: Secondary | ICD-10-CM | POA: Diagnosis not present

## 2017-05-25 DIAGNOSIS — M545 Low back pain: Secondary | ICD-10-CM | POA: Diagnosis not present

## 2017-05-25 DIAGNOSIS — Q7649 Other congenital malformations of spine, not associated with scoliosis: Secondary | ICD-10-CM | POA: Diagnosis not present

## 2017-06-19 DIAGNOSIS — R194 Change in bowel habit: Secondary | ICD-10-CM | POA: Diagnosis not present

## 2017-06-19 DIAGNOSIS — M47816 Spondylosis without myelopathy or radiculopathy, lumbar region: Secondary | ICD-10-CM | POA: Diagnosis not present

## 2017-06-19 DIAGNOSIS — R030 Elevated blood-pressure reading, without diagnosis of hypertension: Secondary | ICD-10-CM | POA: Diagnosis not present

## 2017-06-19 DIAGNOSIS — M5136 Other intervertebral disc degeneration, lumbar region: Secondary | ICD-10-CM | POA: Diagnosis not present

## 2017-06-19 DIAGNOSIS — M545 Low back pain: Secondary | ICD-10-CM | POA: Diagnosis not present

## 2017-06-19 DIAGNOSIS — M4316 Spondylolisthesis, lumbar region: Secondary | ICD-10-CM | POA: Diagnosis not present

## 2017-06-19 DIAGNOSIS — K219 Gastro-esophageal reflux disease without esophagitis: Secondary | ICD-10-CM | POA: Diagnosis not present

## 2017-06-19 DIAGNOSIS — M4155 Other secondary scoliosis, thoracolumbar region: Secondary | ICD-10-CM | POA: Diagnosis not present

## 2017-06-19 DIAGNOSIS — Q7649 Other congenital malformations of spine, not associated with scoliosis: Secondary | ICD-10-CM | POA: Diagnosis not present

## 2017-06-21 DIAGNOSIS — R194 Change in bowel habit: Secondary | ICD-10-CM | POA: Diagnosis not present

## 2017-07-03 DIAGNOSIS — M545 Low back pain: Secondary | ICD-10-CM | POA: Diagnosis not present

## 2017-07-26 DIAGNOSIS — M48062 Spinal stenosis, lumbar region with neurogenic claudication: Secondary | ICD-10-CM | POA: Diagnosis not present

## 2017-07-26 DIAGNOSIS — M5136 Other intervertebral disc degeneration, lumbar region: Secondary | ICD-10-CM | POA: Diagnosis not present

## 2017-07-26 DIAGNOSIS — Q7649 Other congenital malformations of spine, not associated with scoliosis: Secondary | ICD-10-CM | POA: Diagnosis not present

## 2017-07-26 DIAGNOSIS — M545 Low back pain: Secondary | ICD-10-CM | POA: Diagnosis not present

## 2017-07-26 DIAGNOSIS — M4155 Other secondary scoliosis, thoracolumbar region: Secondary | ICD-10-CM | POA: Diagnosis not present

## 2017-07-26 DIAGNOSIS — M4316 Spondylolisthesis, lumbar region: Secondary | ICD-10-CM | POA: Diagnosis not present

## 2017-07-26 DIAGNOSIS — M47816 Spondylosis without myelopathy or radiculopathy, lumbar region: Secondary | ICD-10-CM | POA: Diagnosis not present

## 2017-09-17 DIAGNOSIS — Z0000 Encounter for general adult medical examination without abnormal findings: Secondary | ICD-10-CM | POA: Diagnosis not present

## 2017-09-17 DIAGNOSIS — M8580 Other specified disorders of bone density and structure, unspecified site: Secondary | ICD-10-CM | POA: Diagnosis not present

## 2017-09-17 DIAGNOSIS — Z1389 Encounter for screening for other disorder: Secondary | ICD-10-CM | POA: Diagnosis not present

## 2017-09-17 DIAGNOSIS — M549 Dorsalgia, unspecified: Secondary | ICD-10-CM | POA: Diagnosis not present

## 2017-09-17 DIAGNOSIS — K219 Gastro-esophageal reflux disease without esophagitis: Secondary | ICD-10-CM | POA: Diagnosis not present

## 2017-09-17 DIAGNOSIS — H409 Unspecified glaucoma: Secondary | ICD-10-CM | POA: Diagnosis not present

## 2017-09-17 DIAGNOSIS — J309 Allergic rhinitis, unspecified: Secondary | ICD-10-CM | POA: Diagnosis not present

## 2017-09-17 DIAGNOSIS — Z01419 Encounter for gynecological examination (general) (routine) without abnormal findings: Secondary | ICD-10-CM | POA: Diagnosis not present

## 2017-09-17 DIAGNOSIS — E7800 Pure hypercholesterolemia, unspecified: Secondary | ICD-10-CM | POA: Diagnosis not present

## 2017-11-08 DIAGNOSIS — H401130 Primary open-angle glaucoma, bilateral, stage unspecified: Secondary | ICD-10-CM | POA: Diagnosis not present

## 2017-11-08 DIAGNOSIS — H43812 Vitreous degeneration, left eye: Secondary | ICD-10-CM | POA: Diagnosis not present

## 2017-11-08 DIAGNOSIS — H353131 Nonexudative age-related macular degeneration, bilateral, early dry stage: Secondary | ICD-10-CM | POA: Diagnosis not present

## 2017-11-08 DIAGNOSIS — H43811 Vitreous degeneration, right eye: Secondary | ICD-10-CM | POA: Diagnosis not present

## 2017-11-09 DIAGNOSIS — M4316 Spondylolisthesis, lumbar region: Secondary | ICD-10-CM | POA: Diagnosis not present

## 2017-11-09 DIAGNOSIS — Z5181 Encounter for therapeutic drug level monitoring: Secondary | ICD-10-CM | POA: Diagnosis not present

## 2017-11-09 DIAGNOSIS — Z791 Long term (current) use of non-steroidal anti-inflammatories (NSAID): Secondary | ICD-10-CM | POA: Diagnosis not present

## 2017-11-09 DIAGNOSIS — M4155 Other secondary scoliosis, thoracolumbar region: Secondary | ICD-10-CM | POA: Diagnosis not present

## 2017-11-09 DIAGNOSIS — R030 Elevated blood-pressure reading, without diagnosis of hypertension: Secondary | ICD-10-CM | POA: Diagnosis not present

## 2017-11-09 DIAGNOSIS — Q7649 Other congenital malformations of spine, not associated with scoliosis: Secondary | ICD-10-CM | POA: Diagnosis not present

## 2017-11-09 DIAGNOSIS — M5136 Other intervertebral disc degeneration, lumbar region: Secondary | ICD-10-CM | POA: Diagnosis not present

## 2017-11-09 DIAGNOSIS — M47816 Spondylosis without myelopathy or radiculopathy, lumbar region: Secondary | ICD-10-CM | POA: Diagnosis not present

## 2017-11-09 DIAGNOSIS — M545 Low back pain: Secondary | ICD-10-CM | POA: Diagnosis not present

## 2017-11-09 DIAGNOSIS — M48062 Spinal stenosis, lumbar region with neurogenic claudication: Secondary | ICD-10-CM | POA: Diagnosis not present

## 2017-11-15 ENCOUNTER — Ambulatory Visit (INDEPENDENT_AMBULATORY_CARE_PROVIDER_SITE_OTHER): Payer: 59

## 2017-11-15 ENCOUNTER — Other Ambulatory Visit: Payer: Self-pay | Admitting: Podiatry

## 2017-11-15 ENCOUNTER — Ambulatory Visit (INDEPENDENT_AMBULATORY_CARE_PROVIDER_SITE_OTHER): Payer: 59 | Admitting: Podiatry

## 2017-11-15 ENCOUNTER — Encounter: Payer: Self-pay | Admitting: Podiatry

## 2017-11-15 DIAGNOSIS — M779 Enthesopathy, unspecified: Secondary | ICD-10-CM

## 2017-11-17 NOTE — Progress Notes (Signed)
Subjective:   Patient ID: Samantha Joseph, female   DOB: 77 y.o.   MRN: [id number]   HPI Patient presents stating she woke up a few days with intense discomfort in her left foot and while it improved some but is still very tender when palpated she is worried about broken   ROS      Objective:  Physical Exam  Neurovascular status was intact with patient's left lateral foot showing inflammation discomfort around the peroneal groove and into the base of the fifth metatarsal     Assessment:  Probability for inflammatory condition and cannot rule out any kind of systemic issue     Plan:  Reviewed condition and recommended ice therapy anti-inflammatories and reviewed x-rays with patient.  If symptoms continue may require immobilization or possible steroidal injection treatment  X-rays indicate no signs currently of fracture appears to be inflammatory soft tissue condition

## 2017-11-20 ENCOUNTER — Other Ambulatory Visit: Payer: Self-pay | Admitting: Family Medicine

## 2017-11-20 DIAGNOSIS — Z1231 Encounter for screening mammogram for malignant neoplasm of breast: Secondary | ICD-10-CM

## 2017-12-31 ENCOUNTER — Ambulatory Visit
Admission: RE | Admit: 2017-12-31 | Discharge: 2017-12-31 | Disposition: A | Discharge status: Home / Self Care | Payer: 59 | Source: Ambulatory Visit | Attending: Family Medicine | Admitting: Family Medicine

## 2017-12-31 DIAGNOSIS — Z1231 Encounter for screening mammogram for malignant neoplasm of breast: Secondary | ICD-10-CM

## 2018-02-12 DIAGNOSIS — R197 Diarrhea, unspecified: Secondary | ICD-10-CM | POA: Diagnosis not present

## 2018-02-12 DIAGNOSIS — Z800 Family history of malignant neoplasm of digestive organs: Secondary | ICD-10-CM | POA: Diagnosis not present

## 2018-02-12 DIAGNOSIS — Z86010 Personal history of colonic polyps: Secondary | ICD-10-CM | POA: Diagnosis not present

## 2018-02-12 DIAGNOSIS — R194 Change in bowel habit: Secondary | ICD-10-CM | POA: Diagnosis not present

## 2018-02-21 DIAGNOSIS — R194 Change in bowel habit: Secondary | ICD-10-CM | POA: Diagnosis not present

## 2018-02-21 DIAGNOSIS — R197 Diarrhea, unspecified: Secondary | ICD-10-CM | POA: Diagnosis not present

## 2018-03-20 DIAGNOSIS — M8580 Other specified disorders of bone density and structure, unspecified site: Secondary | ICD-10-CM | POA: Diagnosis not present

## 2018-03-20 DIAGNOSIS — R748 Abnormal levels of other serum enzymes: Secondary | ICD-10-CM | POA: Diagnosis not present

## 2018-03-20 DIAGNOSIS — M810 Age-related osteoporosis without current pathological fracture: Secondary | ICD-10-CM | POA: Diagnosis not present

## 2018-03-28 DIAGNOSIS — M4316 Spondylolisthesis, lumbar region: Secondary | ICD-10-CM | POA: Diagnosis not present

## 2018-03-28 DIAGNOSIS — Q7649 Other congenital malformations of spine, not associated with scoliosis: Secondary | ICD-10-CM | POA: Diagnosis not present

## 2018-03-28 DIAGNOSIS — M47816 Spondylosis without myelopathy or radiculopathy, lumbar region: Secondary | ICD-10-CM | POA: Diagnosis not present

## 2018-03-28 DIAGNOSIS — R030 Elevated blood-pressure reading, without diagnosis of hypertension: Secondary | ICD-10-CM | POA: Diagnosis not present

## 2018-03-28 DIAGNOSIS — M48062 Spinal stenosis, lumbar region with neurogenic claudication: Secondary | ICD-10-CM | POA: Diagnosis not present

## 2018-03-28 DIAGNOSIS — M4155 Other secondary scoliosis, thoracolumbar region: Secondary | ICD-10-CM | POA: Diagnosis not present

## 2018-03-28 DIAGNOSIS — M5136 Other intervertebral disc degeneration, lumbar region: Secondary | ICD-10-CM | POA: Diagnosis not present

## 2018-05-08 DIAGNOSIS — H43811 Vitreous degeneration, right eye: Secondary | ICD-10-CM | POA: Diagnosis not present

## 2018-05-08 DIAGNOSIS — H353131 Nonexudative age-related macular degeneration, bilateral, early dry stage: Secondary | ICD-10-CM | POA: Diagnosis not present

## 2018-05-08 DIAGNOSIS — H401130 Primary open-angle glaucoma, bilateral, stage unspecified: Secondary | ICD-10-CM | POA: Diagnosis not present

## 2018-05-08 DIAGNOSIS — H43812 Vitreous degeneration, left eye: Secondary | ICD-10-CM | POA: Diagnosis not present

## 2018-05-08 DIAGNOSIS — H401131 Primary open-angle glaucoma, bilateral, mild stage: Secondary | ICD-10-CM | POA: Diagnosis not present

## 2018-09-17 DIAGNOSIS — R140 Abdominal distension (gaseous): Secondary | ICD-10-CM | POA: Diagnosis not present

## 2018-09-17 DIAGNOSIS — R194 Change in bowel habit: Secondary | ICD-10-CM | POA: Diagnosis not present

## 2018-09-18 DIAGNOSIS — R194 Change in bowel habit: Secondary | ICD-10-CM | POA: Diagnosis not present

## 2018-10-08 DIAGNOSIS — M8580 Other specified disorders of bone density and structure, unspecified site: Secondary | ICD-10-CM | POA: Diagnosis not present

## 2018-10-08 DIAGNOSIS — M5136 Other intervertebral disc degeneration, lumbar region: Secondary | ICD-10-CM | POA: Diagnosis not present

## 2018-10-08 DIAGNOSIS — M48062 Spinal stenosis, lumbar region with neurogenic claudication: Secondary | ICD-10-CM | POA: Diagnosis not present

## 2018-10-08 DIAGNOSIS — M4316 Spondylolisthesis, lumbar region: Secondary | ICD-10-CM | POA: Diagnosis not present

## 2018-10-08 DIAGNOSIS — Q7649 Other congenital malformations of spine, not associated with scoliosis: Secondary | ICD-10-CM | POA: Diagnosis not present

## 2018-10-08 DIAGNOSIS — M47816 Spondylosis without myelopathy or radiculopathy, lumbar region: Secondary | ICD-10-CM | POA: Diagnosis not present

## 2018-10-08 DIAGNOSIS — M4155 Other secondary scoliosis, thoracolumbar region: Secondary | ICD-10-CM | POA: Diagnosis not present

## 2018-10-08 DIAGNOSIS — M810 Age-related osteoporosis without current pathological fracture: Secondary | ICD-10-CM | POA: Diagnosis not present

## 2018-10-24 ENCOUNTER — Other Ambulatory Visit: Payer: Self-pay | Admitting: Family Medicine

## 2018-10-24 DIAGNOSIS — M8580 Other specified disorders of bone density and structure, unspecified site: Secondary | ICD-10-CM

## 2018-10-31 DIAGNOSIS — M5136 Other intervertebral disc degeneration, lumbar region: Secondary | ICD-10-CM | POA: Diagnosis not present

## 2018-10-31 DIAGNOSIS — M4726 Other spondylosis with radiculopathy, lumbar region: Secondary | ICD-10-CM | POA: Diagnosis not present

## 2018-10-31 DIAGNOSIS — M48061 Spinal stenosis, lumbar region without neurogenic claudication: Secondary | ICD-10-CM | POA: Diagnosis not present

## 2018-11-07 DIAGNOSIS — H43811 Vitreous degeneration, right eye: Secondary | ICD-10-CM | POA: Diagnosis not present

## 2018-11-07 DIAGNOSIS — H401130 Primary open-angle glaucoma, bilateral, stage unspecified: Secondary | ICD-10-CM | POA: Diagnosis not present

## 2018-11-07 DIAGNOSIS — H43812 Vitreous degeneration, left eye: Secondary | ICD-10-CM | POA: Diagnosis not present

## 2018-11-07 DIAGNOSIS — H353131 Nonexudative age-related macular degeneration, bilateral, early dry stage: Secondary | ICD-10-CM | POA: Diagnosis not present

## 2018-11-14 ENCOUNTER — Ambulatory Visit
Admission: RE | Admit: 2018-11-14 | Discharge: 2018-11-14 | Disposition: A | Discharge status: Home / Self Care | Payer: 59 | Source: Ambulatory Visit | Attending: Family Medicine | Admitting: Family Medicine

## 2018-11-14 DIAGNOSIS — M8580 Other specified disorders of bone density and structure, unspecified site: Secondary | ICD-10-CM

## 2018-11-25 ENCOUNTER — Other Ambulatory Visit: Payer: Self-pay | Admitting: Family Medicine

## 2018-11-25 DIAGNOSIS — Z1231 Encounter for screening mammogram for malignant neoplasm of breast: Secondary | ICD-10-CM

## 2019-01-02 ENCOUNTER — Ambulatory Visit: Payer: 59

## 2019-02-12 DIAGNOSIS — M5136 Other intervertebral disc degeneration, lumbar region: Secondary | ICD-10-CM | POA: Diagnosis not present

## 2019-02-12 DIAGNOSIS — M47816 Spondylosis without myelopathy or radiculopathy, lumbar region: Secondary | ICD-10-CM | POA: Diagnosis not present

## 2019-02-12 DIAGNOSIS — M4155 Other secondary scoliosis, thoracolumbar region: Secondary | ICD-10-CM | POA: Diagnosis not present

## 2019-02-12 DIAGNOSIS — M546 Pain in thoracic spine: Secondary | ICD-10-CM | POA: Diagnosis not present

## 2019-02-12 DIAGNOSIS — R030 Elevated blood-pressure reading, without diagnosis of hypertension: Secondary | ICD-10-CM | POA: Diagnosis not present

## 2019-02-12 DIAGNOSIS — Q7649 Other congenital malformations of spine, not associated with scoliosis: Secondary | ICD-10-CM | POA: Diagnosis not present

## 2019-02-12 DIAGNOSIS — M4316 Spondylolisthesis, lumbar region: Secondary | ICD-10-CM | POA: Diagnosis not present

## 2019-02-12 DIAGNOSIS — M48062 Spinal stenosis, lumbar region with neurogenic claudication: Secondary | ICD-10-CM | POA: Diagnosis not present

## 2019-02-17 ENCOUNTER — Ambulatory Visit: Payer: 59

## 2019-03-28 ENCOUNTER — Ambulatory Visit: Payer: 59

## 2019-04-25 ENCOUNTER — Other Ambulatory Visit: Payer: Self-pay

## 2019-04-25 ENCOUNTER — Ambulatory Visit
Admission: RE | Admit: 2019-04-25 | Discharge: 2019-04-25 | Disposition: A | Discharge status: Home / Self Care | Payer: 59 | Source: Ambulatory Visit | Attending: Family Medicine | Admitting: Family Medicine

## 2019-04-25 DIAGNOSIS — Z1231 Encounter for screening mammogram for malignant neoplasm of breast: Secondary | ICD-10-CM

## 2019-10-16 DIAGNOSIS — M25511 Pain in right shoulder: Secondary | ICD-10-CM | POA: Diagnosis not present

## 2019-11-10 DIAGNOSIS — H353131 Nonexudative age-related macular degeneration, bilateral, early dry stage: Secondary | ICD-10-CM | POA: Diagnosis not present

## 2019-11-10 DIAGNOSIS — H401131 Primary open-angle glaucoma, bilateral, mild stage: Secondary | ICD-10-CM | POA: Diagnosis not present

## 2019-11-10 DIAGNOSIS — H43811 Vitreous degeneration, right eye: Secondary | ICD-10-CM | POA: Diagnosis not present

## 2019-11-10 DIAGNOSIS — H43812 Vitreous degeneration, left eye: Secondary | ICD-10-CM | POA: Diagnosis not present

## 2019-12-01 ENCOUNTER — Ambulatory Visit (INDEPENDENT_AMBULATORY_CARE_PROVIDER_SITE_OTHER): Payer: 59 | Admitting: Podiatry

## 2019-12-01 ENCOUNTER — Other Ambulatory Visit: Payer: Self-pay | Admitting: Podiatry

## 2019-12-01 ENCOUNTER — Encounter: Payer: Self-pay | Admitting: Podiatry

## 2019-12-01 ENCOUNTER — Other Ambulatory Visit: Payer: Self-pay

## 2019-12-01 ENCOUNTER — Ambulatory Visit (INDEPENDENT_AMBULATORY_CARE_PROVIDER_SITE_OTHER): Payer: 59

## 2019-12-01 VITALS — Temp 96.3°F

## 2019-12-01 DIAGNOSIS — M205X9 Other deformities of toe(s) (acquired), unspecified foot: Secondary | ICD-10-CM | POA: Diagnosis not present

## 2019-12-01 DIAGNOSIS — M79672 Pain in left foot: Secondary | ICD-10-CM

## 2019-12-01 DIAGNOSIS — M205X1 Other deformities of toe(s) (acquired), right foot: Secondary | ICD-10-CM

## 2019-12-01 DIAGNOSIS — M79671 Pain in right foot: Secondary | ICD-10-CM

## 2019-12-01 DIAGNOSIS — M778 Other enthesopathies, not elsewhere classified: Secondary | ICD-10-CM

## 2019-12-01 DIAGNOSIS — M779 Enthesopathy, unspecified: Secondary | ICD-10-CM

## 2019-12-01 NOTE — Progress Notes (Signed)
Subjective:   Patient ID: Samantha Joseph, female   DOB: 79 y.o.   MRN: [id number]   HPI Patient presents stating she is having a lot of pain on top of her right foot and states it is been going on the last few weeks and she tries to be active and walk at least 2 to 4 miles per day   ROS      Objective:  Physical Exam  Neurovascular status intact with patient noted to have inflammation pain of the third and fourth phalangeal joint right and also has significant loss of motion big toe joint right and left with spurring dorsally     Assessment:  Inflammatory capsulitis third fourth MPJ with hallux limitus deformity bilateral     Plan:  H&P reviewed both conditions number to focus on the acute capsulitis and did discuss possibility of long-term orthotics.  Today I went ahead did proximal nerve block of the right forefoot and utilizing sterile instrumentation I aspirated the third and fourth MPJ right and then carefully injected the joint with 1/4 cc dexamethasone Kenalog combination and thick padding to reduce pressure on the area.  Patient will be seen back for [place] to recheck and we did discuss hallux limitus and its implications with possible orthotics with long-term possible surgery necessary  X-rays indicate that there is significant hallux limitus deformity right over left with bone spur formation

## 2019-12-22 ENCOUNTER — Ambulatory Visit (INDEPENDENT_AMBULATORY_CARE_PROVIDER_SITE_OTHER): Payer: 59 | Admitting: Podiatry

## 2019-12-22 ENCOUNTER — Other Ambulatory Visit: Payer: Self-pay

## 2019-12-22 ENCOUNTER — Encounter: Payer: Self-pay | Admitting: Podiatry

## 2019-12-22 VITALS — Temp 96.1°F

## 2019-12-22 DIAGNOSIS — M779 Enthesopathy, unspecified: Secondary | ICD-10-CM | POA: Diagnosis not present

## 2019-12-22 DIAGNOSIS — M205X9 Other deformities of toe(s) (acquired), unspecified foot: Secondary | ICD-10-CM

## 2019-12-23 NOTE — Progress Notes (Signed)
Subjective:   Patient ID: Samantha Joseph, female   DOB: 79 y.o.   MRN: [id number]   HPI Patient presents stating my right foot is still hurting but it is in a different place and the area that he worked on seems to be improved   ROS      Objective:  Physical Exam  Neurovascular status intact with lesser capsulitis improving with quite a bit of discomfort around the first MPJ right with inflammation noted     Assessment:  Hallux limitus with inflammatory capsulitis right with improved third and fourth MPJ capsulitis     Plan:  H&P reviewed conditions and I went ahead did sterile prep and injected the capsule around the first MPJ 3 mg Dexasone Kenalog 5 mg Xylocaine and I went ahead and I discussed orthotics and applied dancers pad around the first MPJ to see what kind of relief this will give the patient

## 2019-12-24 DIAGNOSIS — H2513 Age-related nuclear cataract, bilateral: Secondary | ICD-10-CM | POA: Insufficient documentation

## 2019-12-24 DIAGNOSIS — H40033 Anatomical narrow angle, bilateral: Secondary | ICD-10-CM | POA: Diagnosis not present

## 2019-12-24 HISTORY — DX: Age-related nuclear cataract, bilateral: H25.13

## 2019-12-29 DIAGNOSIS — K219 Gastro-esophageal reflux disease without esophagitis: Secondary | ICD-10-CM | POA: Diagnosis not present

## 2019-12-29 DIAGNOSIS — L309 Dermatitis, unspecified: Secondary | ICD-10-CM | POA: Diagnosis not present

## 2019-12-29 DIAGNOSIS — M5137 Other intervertebral disc degeneration, lumbosacral region: Secondary | ICD-10-CM | POA: Diagnosis not present

## 2019-12-29 DIAGNOSIS — M810 Age-related osteoporosis without current pathological fracture: Secondary | ICD-10-CM | POA: Diagnosis not present

## 2019-12-29 DIAGNOSIS — H409 Unspecified glaucoma: Secondary | ICD-10-CM | POA: Diagnosis not present

## 2019-12-29 DIAGNOSIS — Z0000 Encounter for general adult medical examination without abnormal findings: Secondary | ICD-10-CM | POA: Diagnosis not present

## 2019-12-29 DIAGNOSIS — Z1389 Encounter for screening for other disorder: Secondary | ICD-10-CM | POA: Diagnosis not present

## 2019-12-29 DIAGNOSIS — J309 Allergic rhinitis, unspecified: Secondary | ICD-10-CM | POA: Diagnosis not present

## 2019-12-29 DIAGNOSIS — E7800 Pure hypercholesterolemia, unspecified: Secondary | ICD-10-CM | POA: Diagnosis not present

## 2020-01-19 ENCOUNTER — Ambulatory Visit: Payer: 59 | Admitting: Podiatry

## 2020-01-20 DIAGNOSIS — M5136 Other intervertebral disc degeneration, lumbar region: Secondary | ICD-10-CM | POA: Diagnosis not present

## 2020-01-20 DIAGNOSIS — M47816 Spondylosis without myelopathy or radiculopathy, lumbar region: Secondary | ICD-10-CM | POA: Diagnosis not present

## 2020-01-20 DIAGNOSIS — G8929 Other chronic pain: Secondary | ICD-10-CM | POA: Diagnosis not present

## 2020-01-20 DIAGNOSIS — M4316 Spondylolisthesis, lumbar region: Secondary | ICD-10-CM | POA: Diagnosis not present

## 2020-01-20 DIAGNOSIS — L570 Actinic keratosis: Secondary | ICD-10-CM | POA: Diagnosis not present

## 2020-01-20 DIAGNOSIS — M48061 Spinal stenosis, lumbar region without neurogenic claudication: Secondary | ICD-10-CM | POA: Diagnosis not present

## 2020-01-20 DIAGNOSIS — M545 Low back pain: Secondary | ICD-10-CM | POA: Diagnosis not present

## 2020-01-20 DIAGNOSIS — Q7649 Other congenital malformations of spine, not associated with scoliosis: Secondary | ICD-10-CM | POA: Diagnosis not present

## 2020-01-21 ENCOUNTER — Other Ambulatory Visit: Payer: Self-pay

## 2020-01-21 ENCOUNTER — Encounter: Payer: Self-pay | Admitting: Podiatry

## 2020-01-21 ENCOUNTER — Ambulatory Visit (INDEPENDENT_AMBULATORY_CARE_PROVIDER_SITE_OTHER): Payer: 59 | Admitting: Podiatry

## 2020-01-21 VITALS — Temp 97.9°F

## 2020-01-21 DIAGNOSIS — M778 Other enthesopathies, not elsewhere classified: Secondary | ICD-10-CM

## 2020-01-21 DIAGNOSIS — M205X9 Other deformities of toe(s) (acquired), unspecified foot: Secondary | ICD-10-CM

## 2020-01-21 DIAGNOSIS — M779 Enthesopathy, unspecified: Secondary | ICD-10-CM

## 2020-01-21 NOTE — Progress Notes (Signed)
Subjective:   Patient ID: Samantha Joseph, female   DOB: 79 y.o.   MRN: [id number]   HPI Patient states I am still having a lot of problems around this joint and it seemed to get some better but it is become bothersome again   ROS      Objective:  Physical Exam  Neurovascular status intact with patient found to have continued inflammation around the first MPJ right with limited motion noted and pain with palpation     Assessment:  Inflammatory capsulitis of the right first MPJ with hallux limitus deformity     Plan:  H&P reviewed condition at great length and since its been relatively short-term I recommended complete immobilization with air fracture walker to reduce all weightbearing forces on the joint and I went ahead today and I did do a careful injection of the lateral joint 3 mg Dexasone Kenalog 5 mg Xylocaine and I advised on orthotics and I am sending to ped orthotist for casting for a relatively thin orthotic with a reverse Morton's extension to be made by [employer]

## 2020-01-29 ENCOUNTER — Ambulatory Visit (INDEPENDENT_AMBULATORY_CARE_PROVIDER_SITE_OTHER): Payer: 59 | Admitting: Orthotics

## 2020-01-29 ENCOUNTER — Other Ambulatory Visit: Payer: Self-pay

## 2020-01-29 DIAGNOSIS — M778 Other enthesopathies, not elsewhere classified: Secondary | ICD-10-CM | POA: Diagnosis not present

## 2020-01-29 DIAGNOSIS — M779 Enthesopathy, unspecified: Secondary | ICD-10-CM

## 2020-01-29 DIAGNOSIS — M205X9 Other deformities of toe(s) (acquired), unspecified foot: Secondary | ICD-10-CM

## 2020-01-29 NOTE — Progress Notes (Signed)
Patient is here today to be evaluated and cast for CMFO.  Patient has hx of functional hallux limitus (FHL), and needs a supportive orthoses that will plantarflex first ray in order to lower hinge pin of first MPJ and enhance windless effect.  Plan of deep heel cup, hug arch, and reverse mortons extension.  Richy to fab.  

## 2020-01-30 DIAGNOSIS — H40033 Anatomical narrow angle, bilateral: Secondary | ICD-10-CM | POA: Diagnosis not present

## 2020-02-10 DIAGNOSIS — Z86010 Personal history of colonic polyps: Secondary | ICD-10-CM | POA: Diagnosis not present

## 2020-02-10 DIAGNOSIS — K580 Irritable bowel syndrome with diarrhea: Secondary | ICD-10-CM | POA: Diagnosis not present

## 2020-02-10 DIAGNOSIS — K641 Second degree hemorrhoids: Secondary | ICD-10-CM | POA: Diagnosis not present

## 2020-02-17 ENCOUNTER — Other Ambulatory Visit: Payer: Self-pay | Admitting: Family Medicine

## 2020-02-17 DIAGNOSIS — Z1231 Encounter for screening mammogram for malignant neoplasm of breast: Secondary | ICD-10-CM

## 2020-02-19 ENCOUNTER — Other Ambulatory Visit: Payer: Self-pay

## 2020-02-19 ENCOUNTER — Ambulatory Visit: Payer: 59 | Admitting: Orthotics

## 2020-02-19 DIAGNOSIS — M205X9 Other deformities of toe(s) (acquired), unspecified foot: Secondary | ICD-10-CM

## 2020-02-19 DIAGNOSIS — M779 Enthesopathy, unspecified: Secondary | ICD-10-CM

## 2020-02-19 NOTE — Progress Notes (Signed)
Patient came in today to p/up functional foot orthotics.   The orthotics were assessed to both fit and function.  The F/O addressed the biomechanical issues/pathologies as intended, offering good longitudinal arch support, proper offloading, and foot support. There weren't any signs of discomfort or irritation.  The F/O fit properly in footwear with minimal trimming/adjustments. 

## 2020-03-10 DIAGNOSIS — H25813 Combined forms of age-related cataract, bilateral: Secondary | ICD-10-CM | POA: Diagnosis not present

## 2020-03-10 DIAGNOSIS — H40033 Anatomical narrow angle, bilateral: Secondary | ICD-10-CM | POA: Diagnosis not present

## 2020-04-13 DIAGNOSIS — M5137 Other intervertebral disc degeneration, lumbosacral region: Secondary | ICD-10-CM | POA: Diagnosis not present

## 2020-04-13 DIAGNOSIS — Z91018 Allergy to other foods: Secondary | ICD-10-CM | POA: Diagnosis not present

## 2020-04-13 DIAGNOSIS — M1990 Unspecified osteoarthritis, unspecified site: Secondary | ICD-10-CM | POA: Diagnosis not present

## 2020-04-26 ENCOUNTER — Ambulatory Visit: Payer: 59

## 2020-04-27 DIAGNOSIS — M545 Low back pain: Secondary | ICD-10-CM | POA: Diagnosis not present

## 2020-04-27 DIAGNOSIS — M542 Cervicalgia: Secondary | ICD-10-CM | POA: Diagnosis not present

## 2020-04-30 DIAGNOSIS — M542 Cervicalgia: Secondary | ICD-10-CM | POA: Diagnosis not present

## 2020-04-30 DIAGNOSIS — M545 Low back pain: Secondary | ICD-10-CM | POA: Diagnosis not present

## 2020-05-05 ENCOUNTER — Other Ambulatory Visit: Payer: Self-pay

## 2020-05-05 ENCOUNTER — Ambulatory Visit
Admission: RE | Admit: 2020-05-05 | Discharge: 2020-05-05 | Disposition: A | Discharge status: Home / Self Care | Payer: 59 | Source: Ambulatory Visit | Attending: Family Medicine | Admitting: Family Medicine

## 2020-05-05 DIAGNOSIS — M542 Cervicalgia: Secondary | ICD-10-CM | POA: Diagnosis not present

## 2020-05-05 DIAGNOSIS — M545 Low back pain: Secondary | ICD-10-CM | POA: Diagnosis not present

## 2020-05-05 DIAGNOSIS — Z1231 Encounter for screening mammogram for malignant neoplasm of breast: Secondary | ICD-10-CM

## 2020-05-20 DIAGNOSIS — M542 Cervicalgia: Secondary | ICD-10-CM | POA: Diagnosis not present

## 2020-05-20 DIAGNOSIS — M545 Low back pain: Secondary | ICD-10-CM | POA: Diagnosis not present

## 2020-05-25 DIAGNOSIS — M545 Low back pain: Secondary | ICD-10-CM | POA: Diagnosis not present

## 2020-05-25 DIAGNOSIS — M542 Cervicalgia: Secondary | ICD-10-CM | POA: Diagnosis not present

## 2020-06-18 ENCOUNTER — Telehealth: Payer: Self-pay | Admitting: Nurse Practitioner

## 2020-06-18 NOTE — Telephone Encounter (Signed)
Called to Discuss with patient about Covid symptoms and the use of the SQ monoclonal antibody injection for those who have been exposed to Covid and at a high risk of hospitalization.     Pt appears to be out of the window to qualify for this injection due to co-morbid conditions and/or a member of an at-risk group in accordance with the FDA Emergency Use Authorization.    Unable to reach pt - left message

## 2020-07-01 DIAGNOSIS — M810 Age-related osteoporosis without current pathological fracture: Secondary | ICD-10-CM | POA: Diagnosis not present

## 2020-10-11 ENCOUNTER — Encounter (INDEPENDENT_AMBULATORY_CARE_PROVIDER_SITE_OTHER): Payer: 59 | Admitting: Ophthalmology

## 2020-10-18 ENCOUNTER — Encounter (INDEPENDENT_AMBULATORY_CARE_PROVIDER_SITE_OTHER): Payer: 59 | Admitting: Ophthalmology

## 2020-10-19 ENCOUNTER — Encounter (INDEPENDENT_AMBULATORY_CARE_PROVIDER_SITE_OTHER): Payer: 59 | Admitting: Ophthalmology

## 2020-10-26 DIAGNOSIS — Z86010 Personal history of colonic polyps: Secondary | ICD-10-CM | POA: Diagnosis not present

## 2020-10-26 DIAGNOSIS — R1033 Periumbilical pain: Secondary | ICD-10-CM | POA: Diagnosis not present

## 2020-10-26 DIAGNOSIS — M5137 Other intervertebral disc degeneration, lumbosacral region: Secondary | ICD-10-CM | POA: Diagnosis not present

## 2020-10-26 DIAGNOSIS — M1990 Unspecified osteoarthritis, unspecified site: Secondary | ICD-10-CM | POA: Diagnosis not present

## 2020-10-26 DIAGNOSIS — R194 Change in bowel habit: Secondary | ICD-10-CM | POA: Diagnosis not present

## 2020-10-27 DIAGNOSIS — G8929 Other chronic pain: Secondary | ICD-10-CM | POA: Diagnosis not present

## 2020-10-27 DIAGNOSIS — M545 Low back pain, unspecified: Secondary | ICD-10-CM | POA: Diagnosis not present

## 2020-11-01 ENCOUNTER — Ambulatory Visit (INDEPENDENT_AMBULATORY_CARE_PROVIDER_SITE_OTHER): Payer: 59 | Admitting: Ophthalmology

## 2020-11-01 ENCOUNTER — Other Ambulatory Visit: Payer: Self-pay

## 2020-11-01 ENCOUNTER — Encounter (INDEPENDENT_AMBULATORY_CARE_PROVIDER_SITE_OTHER): Payer: Self-pay | Admitting: Ophthalmology

## 2020-11-01 DIAGNOSIS — H35363 Drusen (degenerative) of macula, bilateral: Secondary | ICD-10-CM | POA: Diagnosis not present

## 2020-11-01 DIAGNOSIS — H401131 Primary open-angle glaucoma, bilateral, mild stage: Secondary | ICD-10-CM | POA: Diagnosis not present

## 2020-11-01 DIAGNOSIS — H2513 Age-related nuclear cataract, bilateral: Secondary | ICD-10-CM

## 2020-11-01 DIAGNOSIS — H43811 Vitreous degeneration, right eye: Secondary | ICD-10-CM | POA: Diagnosis not present

## 2020-11-01 DIAGNOSIS — H43812 Vitreous degeneration, left eye: Secondary | ICD-10-CM | POA: Diagnosis not present

## 2020-11-01 DIAGNOSIS — H353131 Nonexudative age-related macular degeneration, bilateral, early dry stage: Secondary | ICD-10-CM | POA: Diagnosis not present

## 2020-11-01 DIAGNOSIS — H401132 Primary open-angle glaucoma, bilateral, moderate stage: Secondary | ICD-10-CM | POA: Insufficient documentation

## 2020-11-01 NOTE — Assessment & Plan Note (Addendum)
The nature of cataract was discussed with the patient as well as the elective nature of surgery. The patient was reassured that surgery at a later date does not put the patient at risk for a worse outcome. It was emphasized that the need for surgery is dictated by the patient's quality of life as influenced by the cataract. Patient was instructed to maintain close follow up with their general eye care doctor.  Cataract(s) account for the patient's complaint. I discussed the risks and benefits of cataract surgery. Options were explained to the patient. The patient understands that new glasses may not improve their vision and desires to have cataract surgery. I have recommended follow up with their general eye care doctor for evaluation and consideration of cataract extraction with new intraocular lens insertion. 

## 2020-11-01 NOTE — Assessment & Plan Note (Signed)
No signs of progression, no high risk features.

## 2020-11-01 NOTE — Progress Notes (Signed)
11/01/2020     CHIEF COMPLAINT Patient presents for Retina Follow Up (1 Year Dry AMD f\u OU. OCT/Pt states vision has been stable. Denies new FOL and floaters. Using Timolol and Latanoprost OU daily)   HISTORY OF PRESENT ILLNESS: Samantha Joseph is a 80 y.o. female who presents to the clinic today for:   HPI    Retina Follow Up    Patient presents with  Dry AMD.  In both eyes.  Severity is moderate.  Duration of 1 year.  Since onset it is stable.  I, the attending physician,  performed the HPI with the patient and updated documentation appropriately. Additional comments: 1 Year Dry AMD f\u OU. OCT Pt states vision has been stable. Denies new FOL and floaters. Using Timolol and Latanoprost OU daily       Last edited by Elyse Jarvis on 11/01/2020  9:11 AM. (History)      Referring physician: Maurice Small, MD [address] [place],  [place] [address]  HISTORICAL INFORMATION:   Selected notes from the MEDICAL RECORD NUMBER       CURRENT MEDICATIONS: Current Outpatient Medications (Ophthalmic Drugs)  Medication Sig  . latanoprost (XALATAN) 0.005 % ophthalmic solution 1 drop at bedtime.  . timolol (TIMOPTIC) 0.5 % ophthalmic solution    No current facility-administered medications for this visit. (Ophthalmic Drugs)   Current Outpatient Medications (Other)  Medication Sig  . atorvastatin (LIPITOR) 10 MG tablet Take 10 mg by mouth daily.  . Calcium Acetate, Phos Binder, (CALCIUM ACETATE PO) Take 1 capsule by mouth daily.  Marland Kitchen EPINEPHrine 0.3 mg/0.3 mL IJ SOAJ injection Inject 0.3 mLs (0.3 mg total) into the muscle once. Use as needed for allergic reaction emergency  . fluticasone (FLONASE) 50 MCG/ACT nasal spray   . Omega-3 Fatty Acids (OMEGA 3 PO) Take 2 capsules by mouth daily.  . simvastatin (ZOCOR) 20 MG tablet Take 20 mg by mouth daily.   Current Facility-Administered Medications (Other)  Medication Route  . cetirizine (ZYRTEC) tablet 10 mg Oral   . diphenhydrAMINE (BENADRYL) injection 25 mg Intravenous  . EPINEPHrine (EPI-PEN) injection 0.3 mg Subcutaneous  . methylPREDNISolone sodium succinate (SOLU-MEDROL) 125 mg/2 mL injection 125 mg Intramuscular  . ranitidine (ZANTAC) tablet 150 mg Oral      REVIEW OF SYSTEMS:    ALLERGIES Allergies  Allergen Reactions  . Other     Hazelnuts  . Sulfa Antibiotics Rash    PAST MEDICAL HISTORY Past Medical History:  Diagnosis Date  . Allergy    Past Surgical History:  Procedure Laterality Date  . BREAST CYST ASPIRATION     x2  . TONSILLECTOMY    . TUBAL LIGATION      FAMILY HISTORY Family History  Problem Relation Age of Onset  . Heart disease Father   . Hyperlipidemia Father   . Hypertension Father   . Breast cancer Cousin     SOCIAL HISTORY Social History   Tobacco Use  . Smoking status: Never Smoker  . Smokeless tobacco: Never Used  Substance Use Topics  . Alcohol use: Yes    Comment: socially  . Drug use: No         OPHTHALMIC EXAM:  Base Eye Exam    Visual Acuity (Snellen - Linear)      Right Left   Dist cc 20/30 +1 20/25 +2   Correction: Glasses       Tonometry (Tonopen, 9:15 AM)      Right Left  Pressure 14 15       Pupils      Pupils Dark Light Shape React APD   Right PERRL 3 2 Round Brisk None   Left PERRL 3 2 Round Brisk None       Visual Fields (Counting fingers)      Left Right    Full Full       Neuro/Psych    Oriented x3: Yes   Mood/Affect: Normal       Dilation    Both eyes: 1.0% Mydriacyl, 2.5% Phenylephrine @ 9:15 AM        Slit Lamp and Fundus Exam    External Exam      Right Left   External Normal Normal       Slit Lamp Exam      Right Left   Lids/Lashes Normal Normal   Conjunctiva/Sclera White and quiet White and quiet   Cornea Clear Clear   Anterior Chamber Deep and quiet Deep and quiet   Iris Round and reactive Round and reactive   Lens Nuclear sclerosis , 2+ Nuclear sclerosis 2+ Nuclear  sclerosis   Anterior Vitreous Normal Normal       Fundus Exam      Right Left   Posterior Vitreous Posterior vitreous detachment Posterior vitreous detachment   Disc Normal Normal   C/D Ratio 0.5 0.5   Macula Hard drusen, no hemorrhage, no macular thickening Hard drusen, no hemorrhage, no macular thickening   Vessels Normal Normal   Periphery Normal Normal          IMAGING AND PROCEDURES  Imaging and Procedures for 11/01/20  OCT, Retina - OU - Both Eyes       Right Eye Quality was good. Scan locations included subfoveal. Central Foveal Thickness: 283. Progression has been stable. Findings include retinal drusen , normal foveal contour, no SRF, no IRF.   Left Eye Quality was good. Scan locations included subfoveal. Central Foveal Thickness: 280. Progression has been stable. Findings include retinal drusen , normal foveal contour, no IRF, no SRF.                 ASSESSMENT/PLAN:  Age-related nuclear cataract of both eyes The nature of cataract was discussed with the patient as well as the elective nature of surgery. The patient was reassured that surgery at a later date does not put the patient at risk for a worse outcome. It was emphasized that the need for surgery is dictated by the patient's quality of life as influenced by the cataract. Patient was instructed to maintain close follow up with their general eye care doctor.Cataract(s) account for the patient's complaint. I discussed the risks and benefits of cataract surgery. Options were explained to the patient. The patient understands that new glasses may not improve their vision and desires to have cataract surgery. I have recommended follow up with their general eye care doctor for evaluation and consideration of cataract extraction with new intraocular lens insertion.  Early stage nonexudative age-related macular degeneration of both eyes No signs of progression, no high risk features.  Primary open angle glaucoma  of both eyes, mild stage This condition may improve somewhat post cataract extraction with intraocular lens placement.  Nonetheless I will defer to the expertise of the cataract surgeon (she is seeing someone at [facility]) and Dr. Cammie Mcgee office of [facility] eye care      ICD-10-CM   1. Early stage nonexudative age-related macular degeneration of both eyes  H35.3131  OCT, Retina - OU - Both Eyes  2. Posterior vitreous detachment of right eye  H43.811   3. Posterior vitreous detachment of left eye  H43.812   4. Degenerative retinal drusen of both eyes  H35.363   5. Age-related nuclear cataract of both eyes  H25.13   6. Primary open angle glaucoma of both eyes, mild stage  H40.1131     1.  Long explanation delivered regarding the color change imparted currently by nuclear sclerotic cataract changes in each eye.  2.  No active maculopathy at this time.  Low risk for progression to wet AMD.  3.  Ophthalmic Meds Ordered this visit:  No orders of the defined types were placed in this encounter.      Return in about 1 year (around 11/01/2021) for DILATE OU, OCT.  There are no Patient Instructions on file for this visit.   Explained the diagnoses, plan, and follow up with the patient and they expressed understanding.  Patient expressed understanding of the importance of proper follow up care.   Alford Highland Moustapha Tooker M.D. Diseases & Surgery of the Retina and Vitreous [facility] 11/01/20     Abbreviations: M myopia (nearsighted); A astigmatism; H hyperopia (farsighted); P presbyopia; Mrx spectacle prescription;  CTL contact lenses; OD right eye; OS left eye; OU both eyes  XT exotropia; ET esotropia; PEK punctate epithelial keratitis; PEE punctate epithelial erosions; DES dry eye syndrome; MGD meibomian gland dysfunction; ATs artificial tears; PFAT's preservative free artificial tears; NSC nuclear sclerotic cataract; PSC posterior subcapsular cataract; ERM epi-retinal  membrane; PVD posterior vitreous detachment; RD retinal detachment; DM diabetes mellitus; DR diabetic retinopathy; NPDR non-proliferative diabetic retinopathy; PDR proliferative diabetic retinopathy; CSME clinically significant macular edema; DME diabetic macular edema; dbh dot blot hemorrhages; CWS cotton wool spot; POAG primary open angle glaucoma; C/D cup-to-disc ratio; HVF humphrey visual field; GVF goldmann visual field; OCT optical coherence tomography; IOP intraocular pressure; BRVO Branch retinal vein occlusion; CRVO central retinal vein occlusion; CRAO central retinal artery occlusion; BRAO branch retinal artery occlusion; RT retinal tear; SB scleral buckle; PPV pars plana vitrectomy; VH Vitreous hemorrhage; PRP panretinal laser photocoagulation; IVK intravitreal kenalog; VMT vitreomacular traction; MH Macular hole;  NVD neovascularization of the disc; NVE neovascularization elsewhere; AREDS age related eye disease study; ARMD age related macular degeneration; POAG primary open angle glaucoma; EBMD epithelial/anterior basement membrane dystrophy; ACIOL anterior chamber intraocular lens; IOL intraocular lens; PCIOL posterior chamber intraocular lens; Phaco/IOL phacoemulsification with intraocular lens placement; PRK photorefractive keratectomy; LASIK laser assisted in situ keratomileusis; HTN hypertension; DM diabetes mellitus; COPD chronic obstructive pulmonary disease

## 2020-11-01 NOTE — Assessment & Plan Note (Signed)
This condition may improve somewhat post cataract extraction with intraocular lens placement.  Nonetheless I will defer to the expertise of the cataract surgeon (she is seeing someone at [facility]) and Dr. Cammie Mcgee office of [facility] eye care

## 2020-11-17 DIAGNOSIS — M5137 Other intervertebral disc degeneration, lumbosacral region: Secondary | ICD-10-CM | POA: Diagnosis not present

## 2020-11-17 DIAGNOSIS — M8580 Other specified disorders of bone density and structure, unspecified site: Secondary | ICD-10-CM | POA: Diagnosis not present

## 2020-11-24 DIAGNOSIS — M545 Low back pain, unspecified: Secondary | ICD-10-CM | POA: Diagnosis not present

## 2020-11-24 DIAGNOSIS — G8929 Other chronic pain: Secondary | ICD-10-CM | POA: Diagnosis not present

## 2020-12-03 DIAGNOSIS — M79641 Pain in right hand: Secondary | ICD-10-CM | POA: Diagnosis not present

## 2020-12-28 DIAGNOSIS — R194 Change in bowel habit: Secondary | ICD-10-CM | POA: Diagnosis not present

## 2020-12-28 DIAGNOSIS — R197 Diarrhea, unspecified: Secondary | ICD-10-CM | POA: Diagnosis not present

## 2020-12-28 DIAGNOSIS — Z86010 Personal history of colonic polyps: Secondary | ICD-10-CM | POA: Diagnosis not present

## 2020-12-28 DIAGNOSIS — Z800 Family history of malignant neoplasm of digestive organs: Secondary | ICD-10-CM | POA: Diagnosis not present

## 2020-12-30 ENCOUNTER — Other Ambulatory Visit: Payer: Self-pay | Admitting: Family Medicine

## 2020-12-30 DIAGNOSIS — Z1231 Encounter for screening mammogram for malignant neoplasm of breast: Secondary | ICD-10-CM

## 2020-12-30 DIAGNOSIS — M810 Age-related osteoporosis without current pathological fracture: Secondary | ICD-10-CM

## 2021-01-11 DIAGNOSIS — G8929 Other chronic pain: Secondary | ICD-10-CM | POA: Diagnosis not present

## 2021-01-11 DIAGNOSIS — M545 Low back pain, unspecified: Secondary | ICD-10-CM | POA: Diagnosis not present

## 2021-02-15 DIAGNOSIS — M533 Sacrococcygeal disorders, not elsewhere classified: Secondary | ICD-10-CM | POA: Diagnosis not present

## 2021-02-16 DIAGNOSIS — R194 Change in bowel habit: Secondary | ICD-10-CM | POA: Diagnosis not present

## 2021-04-05 ENCOUNTER — Ambulatory Visit (INDEPENDENT_AMBULATORY_CARE_PROVIDER_SITE_OTHER): Payer: 59 | Admitting: Ophthalmology

## 2021-04-05 ENCOUNTER — Encounter (INDEPENDENT_AMBULATORY_CARE_PROVIDER_SITE_OTHER): Payer: Self-pay | Admitting: Ophthalmology

## 2021-04-05 ENCOUNTER — Other Ambulatory Visit: Payer: Self-pay

## 2021-04-05 DIAGNOSIS — H401132 Primary open-angle glaucoma, bilateral, moderate stage: Secondary | ICD-10-CM | POA: Diagnosis not present

## 2021-04-05 DIAGNOSIS — H353131 Nonexudative age-related macular degeneration, bilateral, early dry stage: Secondary | ICD-10-CM | POA: Diagnosis not present

## 2021-04-05 DIAGNOSIS — H2513 Age-related nuclear cataract, bilateral: Secondary | ICD-10-CM

## 2021-04-05 NOTE — Progress Notes (Signed)
04/05/2021     CHIEF COMPLAINT Patient presents for Eye Problem (WIP- irritation/dull pressure OU and worse in OS/Pt states, "for about the last week, I have had dull aching pain behind both of my eyes. It is a little worse in OS but they are also both very irritated."/Pt reports taking Latanoprost QHS OU/) and Retina Evaluation   HISTORY OF PRESENT ILLNESS: Samantha Joseph is a 80 y.o. female who presents to the clinic today for:   HPI     Eye Problem           Comments: WIP- irritation/dull pressure OU and worse in OS Pt states, "for about the last week, I have had dull aching pain behind both of my eyes. It is a little worse in OS but they are also both very irritated." Pt reports taking Latanoprost QHS OU          Retina Evaluation           Laterality: both eyes       Last edited by Edmon Crape, MD on 04/05/2021  4:14 PM.      Referring physician: Maurice Small, MD [address] [place],  [place] [address]  HISTORICAL INFORMATION:   Selected notes from the MEDICAL RECORD NUMBER       CURRENT MEDICATIONS: Current Outpatient Medications (Ophthalmic Drugs)  Medication Sig   latanoprost (XALATAN) 0.005 % ophthalmic solution 1 drop at bedtime.   timolol (TIMOPTIC) 0.5 % ophthalmic solution    No current facility-administered medications for this visit. (Ophthalmic Drugs)   Current Outpatient Medications (Other)  Medication Sig   atorvastatin (LIPITOR) 10 MG tablet Take 10 mg by mouth daily.   Calcium Acetate, Phos Binder, (CALCIUM ACETATE PO) Take 1 capsule by mouth daily.   EPINEPHrine 0.3 mg/0.3 mL IJ SOAJ injection Inject 0.3 mLs (0.3 mg total) into the muscle once. Use as needed for allergic reaction emergency   fluticasone (FLONASE) 50 MCG/ACT nasal spray    Omega-3 Fatty Acids (OMEGA 3 PO) Take 2 capsules by mouth daily.   simvastatin (ZOCOR) 20 MG tablet Take 20 mg by mouth daily.   Current Facility-Administered Medications  (Other)  Medication Route   cetirizine (ZYRTEC) tablet 10 mg Oral   diphenhydrAMINE (BENADRYL) injection 25 mg Intravenous   EPINEPHrine (EPI-PEN) injection 0.3 mg Subcutaneous   methylPREDNISolone sodium succinate (SOLU-MEDROL) 125 mg/2 mL injection 125 mg Intramuscular   ranitidine (ZANTAC) tablet 150 mg Oral      REVIEW OF SYSTEMS:    ALLERGIES Allergies  Allergen Reactions   Other     Hazelnuts   Sulfa Antibiotics Rash    PAST MEDICAL HISTORY Past Medical History:  Diagnosis Date   Allergy    Past Surgical History:  Procedure Laterality Date   BREAST CYST ASPIRATION     x2   TONSILLECTOMY     TUBAL LIGATION      FAMILY HISTORY Family History  Problem Relation Age of Onset   Heart disease Father    Hyperlipidemia Father    Hypertension Father    Breast cancer Cousin     SOCIAL HISTORY Social History   Tobacco Use   Smoking status: Never   Smokeless tobacco: Never  Substance Use Topics   Alcohol use: Yes    Comment: socially   Drug use: No         OPHTHALMIC EXAM:  Base Eye Exam     Visual Acuity (ETDRS)  Right Left   Dist cc 20/30 +2 20/25   Dist ph cc NI     Correction: Glasses         Tonometry (Tonopen, 3:44 PM)       Right Left   Pressure 14 14         Pupils       Pupils Dark Light Shape React APD   Right PERRL 3 2 Round Brisk None   Left PERRL 3 2 Round Brisk None         Visual Fields (Counting fingers)       Left Right    Full Full         Extraocular Movement       Right Left    Full Full         Neuro/Psych     Oriented x3: Yes   Mood/Affect: Normal         Dilation     Both eyes: 1.0% Mydriacyl, 2.5% Phenylephrine @ 3:44 PM           Slit Lamp and Fundus Exam     External Exam       Right Left   External Normal Normal         Slit Lamp Exam       Right Left   Lids/Lashes Normal Normal   Conjunctiva/Sclera White and quiet White and quiet   Cornea Clear Clear    Anterior Chamber Deep and quiet Deep and quiet   Iris Round and reactive Round and reactive   Lens Nuclear sclerosis , 2+ Nuclear sclerosis 2+ Nuclear sclerosis   Anterior Vitreous Normal Normal         Fundus Exam       Right Left   Posterior Vitreous Posterior vitreous detachment Posterior vitreous detachment   Disc  Normal   C/D Ratio 0.55 0.5   Macula Hard drusen, no hemorrhage, no macular thickening Hard drusen, no hemorrhage, no macular thickening   Vessels Normal Normal   Periphery Normal Normal            IMAGING AND PROCEDURES  Imaging and Procedures for 04/05/21  OCT, Retina - OU - Both Eyes       Right Eye Quality was good. Scan locations included subfoveal. Central Foveal Thickness: 285. Progression has been stable. Findings include retinal drusen , normal foveal contour, no SRF, no IRF.   Left Eye Quality was good. Scan locations included subfoveal. Central Foveal Thickness: 284. Progression has been stable. Findings include retinal drusen , normal foveal contour, no IRF, no SRF.   Notes No signs of active maculopathy OU by OCT or clinical exam             ASSESSMENT/PLAN:  Age-related nuclear cataract of both eyes 2.5-3+ cataract with brunescent's OU, excellent candidate for cataract surgery and lens placement largely due to the visual effects of the cataract but also because the patient has obvious glaucomatous side effects on the optic nerve and the beneficial effect of lowering intraocular pressure thereafter  Patient has scheduled follow-up for planning cataract extraction with intraocular lens placement Dr.Asranni [facility] in August or September 2022  Primary open angle glaucoma of both eyes, moderate stage New splinter hemorrhage on the right eye inferior cup today     ICD-10-CM   1. Early stage nonexudative age-related macular degeneration of both eyes  H35.3131 OCT, Retina - OU - Both Eyes    2. Age-related nuclear cataract of  both eyes  H25.13     3. Primary open angle glaucoma of both eyes, moderate stage  H40.1132       1.  Patient to proceed with cataract extraction with intraocular lens placement within the next 3 to 4 months.  I informed the patient this will assist in lowering the intraocular pressure somewhat yet with the new splinter hemorrhage on the optic nerve right eye today seen on with normal intraocular pressures on 2 medications, certainly a normal pressure glaucoma situation exist in this eye.  This likely reflects proximal nerve vasculature perfusion and thus I would suggest continued use of 2 medications and perhaps more at the judgment of Dr. Coral Else  2.  Follow-up here as needed or in 1 year  3.  Patient also has minor symptoms of pressure buildup behind her eyes.  She does have some ocular surface complaints of irritation but also with fullness or heaviness behind her eyes.  The ocular surface complaints could be from allergies for which there are over-the-counter drops for itching.  Moreover she might consider having evaluation for mild sinusitis if her symptoms continue of the achiness behind the  Ophthalmic Meds Ordered this visit:  No orders of the defined types were placed in this encounter.      Return in about 1 year (around 04/05/2022) for DILATE OU, COLOR FP, OCT.  There are no Patient Instructions on file for this visit.   Explained the diagnoses, plan, and follow up with the patient and they expressed understanding.  Patient expressed understanding of the importance of proper follow up care.   Alford Highland Maymuna Detzel M.D. Diseases & Surgery of the Retina and Vitreous [facility] 04/05/21     Abbreviations: M myopia (nearsighted); A astigmatism; H hyperopia (farsighted); P presbyopia; Mrx spectacle prescription;  CTL contact lenses; OD right eye; OS left eye; OU both eyes  XT exotropia; ET esotropia; PEK punctate epithelial keratitis; PEE punctate epithelial  erosions; DES dry eye syndrome; MGD meibomian gland dysfunction; ATs artificial tears; PFAT's preservative free artificial tears; NSC nuclear sclerotic cataract; PSC posterior subcapsular cataract; ERM epi-retinal membrane; PVD posterior vitreous detachment; RD retinal detachment; DM diabetes mellitus; DR diabetic retinopathy; NPDR non-proliferative diabetic retinopathy; PDR proliferative diabetic retinopathy; CSME clinically significant macular edema; DME diabetic macular edema; dbh dot blot hemorrhages; CWS cotton wool spot; POAG primary open angle glaucoma; C/D cup-to-disc ratio; HVF humphrey visual field; GVF goldmann visual field; OCT optical coherence tomography; IOP intraocular pressure; BRVO Branch retinal vein occlusion; CRVO central retinal vein occlusion; CRAO central retinal artery occlusion; BRAO branch retinal artery occlusion; RT retinal tear; SB scleral buckle; PPV pars plana vitrectomy; VH Vitreous hemorrhage; PRP panretinal laser photocoagulation; IVK intravitreal kenalog; VMT vitreomacular traction; MH Macular hole;  NVD neovascularization of the disc; NVE neovascularization elsewhere; AREDS age related eye disease study; ARMD age related macular degeneration; POAG primary open angle glaucoma; EBMD epithelial/anterior basement membrane dystrophy; ACIOL anterior chamber intraocular lens; IOL intraocular lens; PCIOL posterior chamber intraocular lens; Phaco/IOL phacoemulsification with intraocular lens placement; PRK photorefractive keratectomy; LASIK laser assisted in situ keratomileusis; HTN hypertension; DM diabetes mellitus; COPD chronic obstructive pulmonary disease

## 2021-04-05 NOTE — Assessment & Plan Note (Signed)
New splinter hemorrhage on the right eye inferior cup today

## 2021-04-05 NOTE — Assessment & Plan Note (Addendum)
2.5-3+ cataract with brunescent's OU, excellent candidate for cataract surgery and lens placement largely due to the visual effects of the cataract but also because the patient has obvious glaucomatous side effects on the optic nerve and the beneficial effect of lowering intraocular pressure thereafter  Patient has scheduled follow-up for planning cataract extraction with intraocular lens placement Dr.Asranni [facility] in August or September 2022

## 2021-04-12 DIAGNOSIS — M545 Low back pain, unspecified: Secondary | ICD-10-CM | POA: Diagnosis not present

## 2021-04-18 DIAGNOSIS — H409 Unspecified glaucoma: Secondary | ICD-10-CM

## 2021-04-18 HISTORY — DX: Unspecified glaucoma: H40.9

## 2021-05-13 DIAGNOSIS — M79604 Pain in right leg: Secondary | ICD-10-CM | POA: Diagnosis not present

## 2021-05-13 DIAGNOSIS — M5441 Lumbago with sciatica, right side: Secondary | ICD-10-CM | POA: Diagnosis not present

## 2021-05-13 DIAGNOSIS — M5430 Sciatica, unspecified side: Secondary | ICD-10-CM | POA: Diagnosis not present

## 2021-05-13 DIAGNOSIS — M549 Dorsalgia, unspecified: Secondary | ICD-10-CM | POA: Diagnosis not present

## 2021-05-24 DIAGNOSIS — M5441 Lumbago with sciatica, right side: Secondary | ICD-10-CM | POA: Diagnosis not present

## 2021-05-26 DIAGNOSIS — M5416 Radiculopathy, lumbar region: Secondary | ICD-10-CM | POA: Diagnosis not present

## 2021-06-07 DIAGNOSIS — M48061 Spinal stenosis, lumbar region without neurogenic claudication: Secondary | ICD-10-CM | POA: Diagnosis not present

## 2021-06-07 DIAGNOSIS — M5416 Radiculopathy, lumbar region: Secondary | ICD-10-CM | POA: Diagnosis not present

## 2021-06-07 DIAGNOSIS — M4316 Spondylolisthesis, lumbar region: Secondary | ICD-10-CM | POA: Diagnosis not present

## 2021-06-13 ENCOUNTER — Other Ambulatory Visit: Payer: Self-pay

## 2021-06-13 ENCOUNTER — Ambulatory Visit
Admission: RE | Admit: 2021-06-13 | Discharge: 2021-06-13 | Disposition: A | Discharge status: Home / Self Care | Payer: 59 | Source: Ambulatory Visit | Attending: Family Medicine | Admitting: Family Medicine

## 2021-06-13 DIAGNOSIS — M810 Age-related osteoporosis without current pathological fracture: Secondary | ICD-10-CM

## 2021-06-13 DIAGNOSIS — Z1231 Encounter for screening mammogram for malignant neoplasm of breast: Secondary | ICD-10-CM

## 2021-06-27 DIAGNOSIS — H2512 Age-related nuclear cataract, left eye: Secondary | ICD-10-CM | POA: Diagnosis not present

## 2021-06-27 DIAGNOSIS — H2511 Age-related nuclear cataract, right eye: Secondary | ICD-10-CM | POA: Diagnosis not present

## 2021-06-29 DIAGNOSIS — M5416 Radiculopathy, lumbar region: Secondary | ICD-10-CM | POA: Diagnosis not present

## 2021-06-29 DIAGNOSIS — R030 Elevated blood-pressure reading, without diagnosis of hypertension: Secondary | ICD-10-CM | POA: Diagnosis not present

## 2021-07-12 DIAGNOSIS — M5416 Radiculopathy, lumbar region: Secondary | ICD-10-CM | POA: Diagnosis not present

## 2021-07-28 DIAGNOSIS — M5126 Other intervertebral disc displacement, lumbar region: Secondary | ICD-10-CM | POA: Diagnosis not present

## 2021-08-11 DIAGNOSIS — M5416 Radiculopathy, lumbar region: Secondary | ICD-10-CM | POA: Diagnosis not present

## 2021-08-11 DIAGNOSIS — M5126 Other intervertebral disc displacement, lumbar region: Secondary | ICD-10-CM | POA: Diagnosis not present

## 2021-08-29 DIAGNOSIS — M5416 Radiculopathy, lumbar region: Secondary | ICD-10-CM | POA: Diagnosis not present

## 2021-09-05 DIAGNOSIS — U071 COVID-19: Secondary | ICD-10-CM | POA: Diagnosis not present

## 2021-10-02 HISTORY — PX: EYE SURGERY: SHX253

## 2021-10-07 DIAGNOSIS — H2512 Age-related nuclear cataract, left eye: Secondary | ICD-10-CM | POA: Diagnosis not present

## 2021-10-07 DIAGNOSIS — H2511 Age-related nuclear cataract, right eye: Secondary | ICD-10-CM | POA: Diagnosis not present

## 2021-10-10 DIAGNOSIS — M5116 Intervertebral disc disorders with radiculopathy, lumbar region: Secondary | ICD-10-CM | POA: Diagnosis not present

## 2021-11-01 ENCOUNTER — Encounter (INDEPENDENT_AMBULATORY_CARE_PROVIDER_SITE_OTHER): Payer: 59 | Admitting: Ophthalmology

## 2022-04-06 ENCOUNTER — Encounter (INDEPENDENT_AMBULATORY_CARE_PROVIDER_SITE_OTHER): Payer: Self-pay | Admitting: Ophthalmology

## 2022-04-06 ENCOUNTER — Ambulatory Visit (INDEPENDENT_AMBULATORY_CARE_PROVIDER_SITE_OTHER): Payer: 59 | Admitting: Ophthalmology

## 2022-04-06 DIAGNOSIS — H43811 Vitreous degeneration, right eye: Secondary | ICD-10-CM

## 2022-04-06 DIAGNOSIS — H401132 Primary open-angle glaucoma, bilateral, moderate stage: Secondary | ICD-10-CM

## 2022-04-06 DIAGNOSIS — H353131 Nonexudative age-related macular degeneration, bilateral, early dry stage: Secondary | ICD-10-CM

## 2022-04-06 DIAGNOSIS — H40033 Anatomical narrow angle, bilateral: Secondary | ICD-10-CM

## 2022-04-06 DIAGNOSIS — H43812 Vitreous degeneration, left eye: Secondary | ICD-10-CM | POA: Diagnosis not present

## 2022-04-06 NOTE — Progress Notes (Signed)
04/06/2022     CHIEF COMPLAINT Patient presents for  Chief Complaint  Patient presents with   Macular Degeneration      HISTORY OF PRESENT ILLNESS: Samantha Joseph is a 81 y.o. female who presents to the clinic today for:   HPI   1 YR FU OU OCT FP. Pt stated, "Ive had cataract surgery done in both eyes in May. It was performed by Dr. Georga Hacking.  Of [facility] My distance vision seemed better than it was before but there are some days where it seems like it hasn't changed. My doctor for my glasses says it has improved but I'm not sure if I see it." Pt had back surgery in January.  Last edited by Edmon Crape, MD on 04/06/2022  1:25 PM.      Referring physician: Maurice Small, MD [address] [place],  [place] [address]  HISTORICAL INFORMATION:   Selected notes from the MEDICAL RECORD NUMBER       CURRENT MEDICATIONS: Current Outpatient Medications (Ophthalmic Drugs)  Medication Sig   latanoprost (XALATAN) 0.005 % ophthalmic solution 1 drop at bedtime.   No current facility-administered medications for this visit. (Ophthalmic Drugs)   Current Outpatient Medications (Other)  Medication Sig   atorvastatin (LIPITOR) 10 MG tablet Take 10 mg by mouth daily.   Calcium Acetate, Phos Binder, (CALCIUM ACETATE PO) Take 1 capsule by mouth daily.   EPINEPHrine 0.3 mg/0.3 mL IJ SOAJ injection Inject 0.3 mLs (0.3 mg total) into the muscle once. Use as needed for allergic reaction emergency   fluticasone (FLONASE) 50 MCG/ACT nasal spray    Omega-3 Fatty Acids (OMEGA 3 PO) Take 2 capsules by mouth daily.   simvastatin (ZOCOR) 20 MG tablet Take 20 mg by mouth daily.   Current Facility-Administered Medications (Other)  Medication Route   cetirizine (ZYRTEC) tablet 10 mg Oral   diphenhydrAMINE (BENADRYL) injection 25 mg Intravenous   EPINEPHrine (EPI-PEN) injection 0.3 mg Subcutaneous   methylPREDNISolone sodium succinate (SOLU-MEDROL) 125 mg/2 mL  injection 125 mg Intramuscular   ranitidine (ZANTAC) tablet 150 mg Oral      REVIEW OF SYSTEMS: ROS   Negative for: Constitutional, Gastrointestinal, Neurological, Skin, Genitourinary, Musculoskeletal, HENT, Endocrine, Cardiovascular, Eyes, Respiratory, Psychiatric, Allergic/Imm, Heme/Lymph Last edited by Angeline Slim on 04/06/2022  1:02 PM.       ALLERGIES Allergies  Allergen Reactions   Other     Hazelnuts   Sulfa Antibiotics Rash    PAST MEDICAL HISTORY Past Medical History:  Diagnosis Date   Age-related nuclear cataract of both eyes 12/24/2019   Allergy    Past Surgical History:  Procedure Laterality Date   BREAST CYST ASPIRATION     x2   TONSILLECTOMY     TUBAL LIGATION      FAMILY HISTORY Family History  Problem Relation Age of Onset   Heart disease Father    Hyperlipidemia Father    Hypertension Father    Breast cancer Cousin     SOCIAL HISTORY Social History   Tobacco Use   Smoking status: Never   Smokeless tobacco: Never  Substance Use Topics   Alcohol use: Yes    Comment: socially   Drug use: No         OPHTHALMIC EXAM:  Base Eye Exam     Visual Acuity (ETDRS)       Right Left   Dist cc 20/40 -2 20/30 -2   Dist ph cc 20/25 -1 20/25 -1  Correction: Glasses         Tonometry (Tonopen, 1:08 PM)       Right Left   Pressure 13 15         Pupils       Pupils Dark Light Shape React APD   Right PERRL 3 2 Round Brisk None   Left PERRL 3 2 Round Brisk None         Visual Fields       Left Right    Full Full         Extraocular Movement       Right Left    Full Full         Neuro/Psych     Oriented x3: Yes   Mood/Affect: Normal         Dilation     Both eyes: 1.0% Mydriacyl, 2.5% Phenylephrine @ 1:08 PM           Slit Lamp and Fundus Exam     External Exam       Right Left   External Normal, to external parents yet she does have some mild discomfort to percussion of the frontal sinuses and  maxillary sinuses which could suggest she has underlying mild sinusitis.  Nonetheless no overt febrile illness Normal         Slit Lamp Exam       Right Left   Lids/Lashes Normal Normal   Conjunctiva/Sclera White and quiet White and quiet   Cornea Clear Clear   Anterior Chamber Deep and quiet Deep and quiet   Iris Round and reactive Round and reactive   Lens Centered posterior chamber intraocular lens Centered posterior chamber intraocular lens   Anterior Vitreous Normal Normal         Fundus Exam       Right Left   Posterior Vitreous Posterior vitreous detachment Posterior vitreous detachment   Disc Normal Normal   C/D Ratio 0.55 0.5   Macula Hard drusen, no hemorrhage, no macular thickening Hard drusen, no hemorrhage, no macular thickening   Vessels Normal Normal   Periphery Normal Normal            IMAGING AND PROCEDURES  Imaging and Procedures for 04/06/22  OCT, Retina - OU - Both Eyes       Right Eye Quality was good. Scan locations included subfoveal. Central Foveal Thickness: 300. Progression has been stable. Findings include normal foveal contour, no IRF, no SRF, retinal drusen .   Left Eye Quality was good. Scan locations included subfoveal. Central Foveal Thickness: 303. Progression has been stable. Findings include normal foveal contour, no IRF, no SRF, retinal drusen .   Notes No signs of active maculopathy OU by OCT or clinical exam      Color Fundus Photography Optos - OU - Both Eyes       Right Eye Progression has no prior data. Disc findings include normal observations. Macula : normal observations. Vessels : normal observations. Periphery : normal observations.   Left Eye Progression has no prior data. Disc findings include normal observations. Macula : normal observations. Vessels : normal observations. Periphery : normal observations.              ASSESSMENT/PLAN:  Early stage nonexudative age-related macular degeneration of  both eyes The nature of age--related macular degeneration was discussed with the patient as well as the distinction between dry and wet types. Checking an Amsler Grid daily with advice to return immediately should a  distortion develop, was given to the patient. The patient 's smoking status now and in the past was determined and advice based on the AREDS study was provided regarding the consumption of antioxidant supplements. AREDS 2 vitamin formulation was recommended. Consumption of dark leafy vegetables and fresh fruits of various colors was recommended. Treatment modalities for wet macular degeneration particularly the use of intravitreal injections of anti-blood vessel growth factors was discussed with the patient. Avastin, Lucentis, and Eylea are the available options. On occasion, therapy includes the use of photodynamic therapy and thermal laser. Stressed to the patient do not rub eyes.  Patient was advised to check Amsler Grid daily and return immediately if changes are noted. Instructions on using the grid were given to the patient. All patient questions were answered.  Narrow angle glaucoma suspect of both eyes Resolved post cataract surgery  Posterior vitreous detachment of left eye  The nature of posterior vitreous detachment was discussed with the patient as well as its physiology, its age prevalence, and its possible implication regarding retinal breaks and detachment.  An informational brochure was offered to the patient.  All the patient's questions were answered.  The patient was asked to return if new or different flashes or floaters develops.   Patient was instructed to contact office immediately if any new changes were noticed. I explained to the patient that vitreous inside the eye is similar to jello inside a bowl. As the jello melts it can start to pull away from the bowl, similarly the vitreous throughout our lives can begin to pull away from the retina. That process is called a  posterior vitreous detachment. In some cases, the vitreous can tug hard enough on the retina to form a retinal tear. I discussed with the patient the signs and symptoms of a retinal detachment.  Do not rub the eye.    Posterior vitreous detachment of right eye Physiologic OU  Primary open angle glaucoma of both eyes, moderate stage Likely intraocular pressure OU has improved post cataract surgery in this previous narrow angle eye configuration.  Nonetheless proven splinter hemorrhage on the optic nerve in the past February 2021 thus might consider use of ongoing medication and continue to latanoprost nightly as a possibility,  Follow-up Dr. Lelon Mast as scheduled       ICD-10-CM   1. Early stage nonexudative age-related macular degeneration of both eyes  H35.3131 OCT, Retina - OU - Both Eyes    Color Fundus Photography Optos - OU - Both Eyes    2. Narrow angle glaucoma suspect of both eyes  H40.033     3. Posterior vitreous detachment of left eye  H43.812     4. Posterior vitreous detachment of right eye  H43.811     5. Primary open angle glaucoma of both eyes, moderate stage  H40.1132       1.  OU, with some early stages of optic nerve change from previous splinter hemorrhage documented February 2021 when patient was still phakic  2.  Recent pseudophakia OU.  Some lingering under corrected refractive error may be present, pinhole acuity today does improve OU.  Patient complains of still blurred vision.    3.  Early stage ARMD not high risk features.  Follow-up again in 1 year  Ophthalmic Meds Ordered this visit:  No orders of the defined types were placed in this encounter.      Return in about 1 year (around 04/07/2023) for DILATE OU, OCT.  There are no Patient Instructions  on file for this visit.   Explained the diagnoses, plan, and follow up with the patient and they expressed understanding.  Patient expressed understanding of the importance of proper follow up care.    Alford Highland Starlit Raburn M.D. Diseases & Surgery of the Retina and Vitreous [facility] 04/06/22     Abbreviations: M myopia (nearsighted); A astigmatism; H hyperopia (farsighted); P presbyopia; Mrx spectacle prescription;  CTL contact lenses; OD right eye; OS left eye; OU both eyes  XT exotropia; ET esotropia; PEK punctate epithelial keratitis; PEE punctate epithelial erosions; DES dry eye syndrome; MGD meibomian gland dysfunction; ATs artificial tears; PFAT's preservative free artificial tears; NSC nuclear sclerotic cataract; PSC posterior subcapsular cataract; ERM epi-retinal membrane; PVD posterior vitreous detachment; RD retinal detachment; DM diabetes mellitus; DR diabetic retinopathy; NPDR non-proliferative diabetic retinopathy; PDR proliferative diabetic retinopathy; CSME clinically significant macular edema; DME diabetic macular edema; dbh dot blot hemorrhages; CWS cotton wool spot; POAG primary open angle glaucoma; C/D cup-to-disc ratio; HVF humphrey visual field; GVF goldmann visual field; OCT optical coherence tomography; IOP intraocular pressure; BRVO Branch retinal vein occlusion; CRVO central retinal vein occlusion; CRAO central retinal artery occlusion; BRAO branch retinal artery occlusion; RT retinal tear; SB scleral buckle; PPV pars plana vitrectomy; VH Vitreous hemorrhage; PRP panretinal laser photocoagulation; IVK intravitreal kenalog; VMT vitreomacular traction; MH Macular hole;  NVD neovascularization of the disc; NVE neovascularization elsewhere; AREDS age related eye disease study; ARMD age related macular degeneration; POAG primary open angle glaucoma; EBMD epithelial/anterior basement membrane dystrophy; ACIOL anterior chamber intraocular lens; IOL intraocular lens; PCIOL posterior chamber intraocular lens; Phaco/IOL phacoemulsification with intraocular lens placement; PRK photorefractive keratectomy; LASIK laser assisted in situ keratomileusis; HTN hypertension; DM  diabetes mellitus; COPD chronic obstructive pulmonary disease

## 2022-04-06 NOTE — Assessment & Plan Note (Signed)
Resolved post cataract surgery

## 2022-04-06 NOTE — Assessment & Plan Note (Signed)
Physiologic OU 

## 2022-04-06 NOTE — Assessment & Plan Note (Signed)

## 2022-04-06 NOTE — Assessment & Plan Note (Addendum)
Likely intraocular pressure OU has improved post cataract surgery in this previous narrow angle eye configuration.  Nonetheless proven splinter hemorrhage on the optic nerve in the past February 2021 thus might consider use of ongoing medication and continue to latanoprost nightly as a possibility,  Follow-up Dr. Haynes Dage as scheduled

## 2022-04-06 NOTE — Assessment & Plan Note (Signed)

## 2022-05-12 ENCOUNTER — Other Ambulatory Visit: Payer: Self-pay | Admitting: Family Medicine

## 2022-05-12 DIAGNOSIS — Z1231 Encounter for screening mammogram for malignant neoplasm of breast: Secondary | ICD-10-CM

## 2022-05-17 ENCOUNTER — Ambulatory Visit (INDEPENDENT_AMBULATORY_CARE_PROVIDER_SITE_OTHER): Payer: 59 | Admitting: Podiatry

## 2022-05-17 ENCOUNTER — Ambulatory Visit (INDEPENDENT_AMBULATORY_CARE_PROVIDER_SITE_OTHER): Payer: 59

## 2022-05-17 ENCOUNTER — Encounter: Payer: Self-pay | Admitting: Podiatry

## 2022-05-17 DIAGNOSIS — M779 Enthesopathy, unspecified: Secondary | ICD-10-CM

## 2022-05-17 DIAGNOSIS — M79672 Pain in left foot: Secondary | ICD-10-CM

## 2022-05-17 DIAGNOSIS — M79674 Pain in right toe(s): Secondary | ICD-10-CM

## 2022-05-17 NOTE — Progress Notes (Signed)
Subjective:   Patient ID: Samantha Joseph, female   DOB: 81 y.o.   MRN: [id number]   HPI Patient presents stating that she traumatized her right foot and is concerned about injury and it was the second digit big digit and she needs new orthotics and does she have arthritis in the big toe joints   ROS      Objective:  Physical Exam  Neurovascular status intact with inflammation pain around the first MPJ bilateral with trauma to the right second digit with swelling of the toe that is occurring     Assessment:  Inflammatory capsulitis of the MPJs consistent with arthritis and also trauma to the right second digit     Plan:  H&P x-ray reviewed right discussed the hallux limitus condition not getting treated currently wearing a maker new orthotics and she will have new custom orthotics made at this point and I went ahead and I do not recommend treatment of the second digit as I did not note any injury on x-ray  X-ray indicates there is arthritis of the first MPJ right with spur formation narrowing of the joint surface no other pathology

## 2022-05-31 ENCOUNTER — Other Ambulatory Visit: Payer: Self-pay | Admitting: Podiatry

## 2022-05-31 DIAGNOSIS — M779 Enthesopathy, unspecified: Secondary | ICD-10-CM

## 2022-06-06 ENCOUNTER — Ambulatory Visit (INDEPENDENT_AMBULATORY_CARE_PROVIDER_SITE_OTHER): Payer: 59 | Admitting: *Deleted

## 2022-06-06 NOTE — Progress Notes (Signed)
Patient presents today to pick up custom molded foot orthotics, diagnosed with capsulitis by Dr. Charlsie Merles.   Orthotics were dispensed and fit was satisfactory. Reviewed instructions for break-in and wear. Written instructions given to patient.  Patient will follow up as needed.   [facility] - order # [id number]  Patient wanted her old orthotics refurbished. We will send those to the lab and contact her for pick up when they arrive.

## 2022-06-07 ENCOUNTER — Other Ambulatory Visit (HOSPITAL_BASED_OUTPATIENT_CLINIC_OR_DEPARTMENT_OTHER): Payer: Self-pay

## 2022-06-07 MED ORDER — AREXVY 120 MCG/0.5ML IM SUSR
INTRAMUSCULAR | 0 refills | Status: DC
  Filled 2022-06-07: qty 0.5, 1d supply, fill #0

## 2022-06-08 ENCOUNTER — Other Ambulatory Visit (HOSPITAL_BASED_OUTPATIENT_CLINIC_OR_DEPARTMENT_OTHER): Payer: Self-pay

## 2022-06-20 ENCOUNTER — Telehealth: Payer: Self-pay | Admitting: Podiatry

## 2022-06-20 NOTE — Telephone Encounter (Signed)
Lvm for patient to schedule PUO

## 2022-06-22 ENCOUNTER — Ambulatory Visit
Admission: RE | Admit: 2022-06-22 | Discharge: 2022-06-22 | Disposition: A | Discharge status: Home / Self Care | Payer: 59 | Source: Ambulatory Visit | Attending: Family Medicine | Admitting: Family Medicine

## 2022-06-22 DIAGNOSIS — Z1231 Encounter for screening mammogram for malignant neoplasm of breast: Secondary | ICD-10-CM

## 2022-06-24 DIAGNOSIS — Z0100 Encounter for examination of eyes and vision without abnormal findings: Secondary | ICD-10-CM | POA: Diagnosis not present

## 2022-06-29 ENCOUNTER — Ambulatory Visit (INDEPENDENT_AMBULATORY_CARE_PROVIDER_SITE_OTHER): Payer: 59

## 2022-06-29 DIAGNOSIS — K52832 Lymphocytic colitis: Secondary | ICD-10-CM | POA: Diagnosis not present

## 2022-06-29 DIAGNOSIS — R197 Diarrhea, unspecified: Secondary | ICD-10-CM | POA: Diagnosis not present

## 2022-06-29 DIAGNOSIS — M779 Enthesopathy, unspecified: Secondary | ICD-10-CM

## 2022-06-29 DIAGNOSIS — Z86010 Personal history of colonic polyps: Secondary | ICD-10-CM | POA: Diagnosis not present

## 2022-06-29 NOTE — Progress Notes (Signed)
Patient presents today to pick up custom molded foot orthotics, diagnosed with tendinitis by Dr. Paulla Dolly.   Orthotics were dispensed and fit was satisfactory. Reviewed instructions for break-in and wear. Written instructions given to patient.  Patient will follow up as needed.   [facility] - order # [id number] (refurbish)

## 2022-09-01 ENCOUNTER — Other Ambulatory Visit (HOSPITAL_BASED_OUTPATIENT_CLINIC_OR_DEPARTMENT_OTHER): Payer: Self-pay

## 2022-09-01 MED ORDER — FLUAD QUADRIVALENT 0.5 ML IM PRSY
PREFILLED_SYRINGE | INTRAMUSCULAR | 0 refills | Status: DC
  Filled 2022-09-01: qty 0.5, 1d supply, fill #0

## 2022-10-04 ENCOUNTER — Ambulatory Visit (INDEPENDENT_AMBULATORY_CARE_PROVIDER_SITE_OTHER): Payer: 59 | Admitting: Podiatry

## 2022-10-04 ENCOUNTER — Encounter: Payer: Self-pay | Admitting: Podiatry

## 2022-10-04 ENCOUNTER — Ambulatory Visit (INDEPENDENT_AMBULATORY_CARE_PROVIDER_SITE_OTHER): Payer: 59

## 2022-10-04 DIAGNOSIS — M84375A Stress fracture, left foot, initial encounter for fracture: Secondary | ICD-10-CM | POA: Diagnosis not present

## 2022-10-04 DIAGNOSIS — M779 Enthesopathy, unspecified: Secondary | ICD-10-CM

## 2022-10-04 NOTE — Progress Notes (Signed)
Subjective:   Patient ID: Samantha Joseph, female   DOB: 82 y.o.   MRN: [id number]   HPI Patient presents with a lot of pain of 2 weeks duration left forefoot and feels like she is walking on a broken bone.  Does not remember specific injury   ROS      Objective:  Physical Exam  Neurovascular status intact muscle strength adequate with swelling around the second metatarsal shaft left in the more distal portion of the shaft with quite a bit of discomfort in this area     Assessment:  Probable already for stress fracture of the left second metatarsal     Plan:  The x-rays reviewed condition discussed at great length with review of stress fracture.  At this point complete immobilization recommended and patient has a boot at home that she will start wearing and I want her to use ice and compression.  Reappoint 3 weeks with education given today concerning stress fracture  X-rays indicate a suspicious fracture line at the neck of the second metatarsal left

## 2022-10-25 ENCOUNTER — Ambulatory Visit (INDEPENDENT_AMBULATORY_CARE_PROVIDER_SITE_OTHER): Payer: 59

## 2022-10-25 ENCOUNTER — Encounter: Payer: Self-pay | Admitting: Podiatry

## 2022-10-25 ENCOUNTER — Ambulatory Visit (INDEPENDENT_AMBULATORY_CARE_PROVIDER_SITE_OTHER): Payer: 59 | Admitting: Podiatry

## 2022-10-25 DIAGNOSIS — M84375A Stress fracture, left foot, initial encounter for fracture: Secondary | ICD-10-CM

## 2022-10-25 DIAGNOSIS — M84375D Stress fracture, left foot, subsequent encounter for fracture with routine healing: Secondary | ICD-10-CM | POA: Diagnosis not present

## 2022-10-25 NOTE — Progress Notes (Signed)
Subjective:   Patient ID: Samantha Joseph, female   DOB: 82 y.o.   MRN: [id number]   HPI Patient states feeling better still having discomfort but surgical shoe helps a lot   ROS      Objective:  Physical Exam  Neurovascular status intact with inflammation still around the second metatarsal distal shaft improved but still moderate swelling discomfort     Assessment:  Stress fracture left second metatarsal     Plan:  H&P x-ray reviewed continue surgical shoe part-time gradual return to rigid bottom shoes and discussed the possibility for adjacent stress fracture if she is not careful.  Patient will be seen back to recheck  X-rays indicate healing fracture distal second metatarsal left foot

## 2022-11-07 DIAGNOSIS — L821 Other seborrheic keratosis: Secondary | ICD-10-CM | POA: Diagnosis not present

## 2022-11-07 DIAGNOSIS — M25552 Pain in left hip: Secondary | ICD-10-CM | POA: Diagnosis not present

## 2022-11-07 DIAGNOSIS — M5136 Other intervertebral disc degeneration, lumbar region: Secondary | ICD-10-CM | POA: Diagnosis not present

## 2022-11-07 DIAGNOSIS — M5431 Sciatica, right side: Secondary | ICD-10-CM | POA: Diagnosis not present

## 2022-11-13 DIAGNOSIS — M5126 Other intervertebral disc displacement, lumbar region: Secondary | ICD-10-CM | POA: Diagnosis not present

## 2022-11-13 DIAGNOSIS — S92812S Other fracture of left foot, sequela: Secondary | ICD-10-CM | POA: Diagnosis not present

## 2022-11-13 DIAGNOSIS — R2689 Other abnormalities of gait and mobility: Secondary | ICD-10-CM | POA: Diagnosis not present

## 2022-11-13 DIAGNOSIS — R2681 Unsteadiness on feet: Secondary | ICD-10-CM | POA: Diagnosis not present

## 2022-11-14 DIAGNOSIS — M5416 Radiculopathy, lumbar region: Secondary | ICD-10-CM | POA: Diagnosis not present

## 2022-11-20 DIAGNOSIS — R2681 Unsteadiness on feet: Secondary | ICD-10-CM | POA: Diagnosis not present

## 2022-11-20 DIAGNOSIS — M5126 Other intervertebral disc displacement, lumbar region: Secondary | ICD-10-CM | POA: Diagnosis not present

## 2022-11-20 DIAGNOSIS — S92812S Other fracture of left foot, sequela: Secondary | ICD-10-CM | POA: Diagnosis not present

## 2022-11-20 DIAGNOSIS — R2689 Other abnormalities of gait and mobility: Secondary | ICD-10-CM | POA: Diagnosis not present

## 2022-11-21 DIAGNOSIS — H40039 Anatomical narrow angle, unspecified eye: Secondary | ICD-10-CM | POA: Diagnosis not present

## 2022-11-21 DIAGNOSIS — Z961 Presence of intraocular lens: Secondary | ICD-10-CM | POA: Diagnosis not present

## 2022-11-22 DIAGNOSIS — R2681 Unsteadiness on feet: Secondary | ICD-10-CM | POA: Diagnosis not present

## 2022-11-22 DIAGNOSIS — S92812S Other fracture of left foot, sequela: Secondary | ICD-10-CM | POA: Diagnosis not present

## 2022-11-22 DIAGNOSIS — R2689 Other abnormalities of gait and mobility: Secondary | ICD-10-CM | POA: Diagnosis not present

## 2022-11-22 DIAGNOSIS — M5126 Other intervertebral disc displacement, lumbar region: Secondary | ICD-10-CM | POA: Diagnosis not present

## 2022-12-01 DIAGNOSIS — S92812S Other fracture of left foot, sequela: Secondary | ICD-10-CM | POA: Diagnosis not present

## 2022-12-01 DIAGNOSIS — R2689 Other abnormalities of gait and mobility: Secondary | ICD-10-CM | POA: Diagnosis not present

## 2022-12-01 DIAGNOSIS — M5126 Other intervertebral disc displacement, lumbar region: Secondary | ICD-10-CM | POA: Diagnosis not present

## 2022-12-01 DIAGNOSIS — R2681 Unsteadiness on feet: Secondary | ICD-10-CM | POA: Diagnosis not present

## 2022-12-05 DIAGNOSIS — M5126 Other intervertebral disc displacement, lumbar region: Secondary | ICD-10-CM | POA: Diagnosis not present

## 2022-12-05 DIAGNOSIS — S92812S Other fracture of left foot, sequela: Secondary | ICD-10-CM | POA: Diagnosis not present

## 2022-12-05 DIAGNOSIS — R2681 Unsteadiness on feet: Secondary | ICD-10-CM | POA: Diagnosis not present

## 2022-12-05 DIAGNOSIS — R2689 Other abnormalities of gait and mobility: Secondary | ICD-10-CM | POA: Diagnosis not present

## 2022-12-07 DIAGNOSIS — M5126 Other intervertebral disc displacement, lumbar region: Secondary | ICD-10-CM | POA: Diagnosis not present

## 2022-12-07 DIAGNOSIS — R2681 Unsteadiness on feet: Secondary | ICD-10-CM | POA: Diagnosis not present

## 2022-12-07 DIAGNOSIS — R2689 Other abnormalities of gait and mobility: Secondary | ICD-10-CM | POA: Diagnosis not present

## 2022-12-07 DIAGNOSIS — S92812S Other fracture of left foot, sequela: Secondary | ICD-10-CM | POA: Diagnosis not present

## 2022-12-11 DIAGNOSIS — R2689 Other abnormalities of gait and mobility: Secondary | ICD-10-CM | POA: Diagnosis not present

## 2022-12-11 DIAGNOSIS — R2681 Unsteadiness on feet: Secondary | ICD-10-CM | POA: Diagnosis not present

## 2022-12-11 DIAGNOSIS — M5126 Other intervertebral disc displacement, lumbar region: Secondary | ICD-10-CM | POA: Diagnosis not present

## 2022-12-11 DIAGNOSIS — S92812S Other fracture of left foot, sequela: Secondary | ICD-10-CM | POA: Diagnosis not present

## 2022-12-13 DIAGNOSIS — R2681 Unsteadiness on feet: Secondary | ICD-10-CM | POA: Diagnosis not present

## 2022-12-13 DIAGNOSIS — M5126 Other intervertebral disc displacement, lumbar region: Secondary | ICD-10-CM | POA: Diagnosis not present

## 2022-12-13 DIAGNOSIS — S92812S Other fracture of left foot, sequela: Secondary | ICD-10-CM | POA: Diagnosis not present

## 2022-12-13 DIAGNOSIS — R2689 Other abnormalities of gait and mobility: Secondary | ICD-10-CM | POA: Diagnosis not present

## 2022-12-15 ENCOUNTER — Encounter: Payer: Self-pay | Admitting: Podiatry

## 2022-12-15 ENCOUNTER — Ambulatory Visit (INDEPENDENT_AMBULATORY_CARE_PROVIDER_SITE_OTHER): Payer: 59

## 2022-12-15 ENCOUNTER — Ambulatory Visit (INDEPENDENT_AMBULATORY_CARE_PROVIDER_SITE_OTHER): Payer: 59 | Admitting: Podiatry

## 2022-12-15 DIAGNOSIS — M84375A Stress fracture, left foot, initial encounter for fracture: Secondary | ICD-10-CM | POA: Diagnosis not present

## 2022-12-15 NOTE — Progress Notes (Signed)
Subjective:   Patient ID: Samantha Joseph, female   DOB: 82 y.o.   MRN: [id number]   HPI Patient presents stating she still has swelling in her left foot and she was concerned and wanted make sure everything was okay and states that the boot did cause her to develop some hip pain   ROS      Objective:  Physical Exam  Neurovascular status intact with patient found to have a area of swelling on the dorsal second metatarsal distal shaft where there is a history of stress fracture     Assessment:  Significant stress fracture second metatarsal distal shaft left that is been painful gradually getting better but still a problem     Plan:  H&P x-rays reviewed and at this point I have recommended ice therapy rigid bottom shoes not shoes were flexibility and again discussed with her chances for fracture of the third metatarsal she is not careful.  Should heal uneventfully but may take another 4 to 6 weeks  X-rays do indicate severe fracture distal shaft left that is healing but will still take time to completely consolidate

## 2022-12-18 DIAGNOSIS — R2681 Unsteadiness on feet: Secondary | ICD-10-CM | POA: Diagnosis not present

## 2022-12-18 DIAGNOSIS — S92812S Other fracture of left foot, sequela: Secondary | ICD-10-CM | POA: Diagnosis not present

## 2022-12-18 DIAGNOSIS — M5126 Other intervertebral disc displacement, lumbar region: Secondary | ICD-10-CM | POA: Diagnosis not present

## 2022-12-18 DIAGNOSIS — R2689 Other abnormalities of gait and mobility: Secondary | ICD-10-CM | POA: Diagnosis not present

## 2022-12-20 DIAGNOSIS — M5126 Other intervertebral disc displacement, lumbar region: Secondary | ICD-10-CM | POA: Diagnosis not present

## 2022-12-20 DIAGNOSIS — S92812S Other fracture of left foot, sequela: Secondary | ICD-10-CM | POA: Diagnosis not present

## 2022-12-20 DIAGNOSIS — R2681 Unsteadiness on feet: Secondary | ICD-10-CM | POA: Diagnosis not present

## 2022-12-20 DIAGNOSIS — R2689 Other abnormalities of gait and mobility: Secondary | ICD-10-CM | POA: Diagnosis not present

## 2022-12-27 DIAGNOSIS — M5126 Other intervertebral disc displacement, lumbar region: Secondary | ICD-10-CM | POA: Diagnosis not present

## 2022-12-27 DIAGNOSIS — R2689 Other abnormalities of gait and mobility: Secondary | ICD-10-CM | POA: Diagnosis not present

## 2022-12-27 DIAGNOSIS — S92812S Other fracture of left foot, sequela: Secondary | ICD-10-CM | POA: Diagnosis not present

## 2022-12-27 DIAGNOSIS — R2681 Unsteadiness on feet: Secondary | ICD-10-CM | POA: Diagnosis not present

## 2022-12-28 DIAGNOSIS — M5416 Radiculopathy, lumbar region: Secondary | ICD-10-CM | POA: Diagnosis not present

## 2022-12-29 DIAGNOSIS — R2689 Other abnormalities of gait and mobility: Secondary | ICD-10-CM | POA: Diagnosis not present

## 2022-12-29 DIAGNOSIS — M5126 Other intervertebral disc displacement, lumbar region: Secondary | ICD-10-CM | POA: Diagnosis not present

## 2022-12-29 DIAGNOSIS — S92812S Other fracture of left foot, sequela: Secondary | ICD-10-CM | POA: Diagnosis not present

## 2022-12-29 DIAGNOSIS — R2681 Unsteadiness on feet: Secondary | ICD-10-CM | POA: Diagnosis not present

## 2023-01-10 DIAGNOSIS — M5416 Radiculopathy, lumbar region: Secondary | ICD-10-CM | POA: Diagnosis not present

## 2023-01-31 DIAGNOSIS — L570 Actinic keratosis: Secondary | ICD-10-CM | POA: Diagnosis not present

## 2023-01-31 DIAGNOSIS — H401131 Primary open-angle glaucoma, bilateral, mild stage: Secondary | ICD-10-CM | POA: Diagnosis not present

## 2023-01-31 DIAGNOSIS — M5137 Other intervertebral disc degeneration, lumbosacral region: Secondary | ICD-10-CM | POA: Diagnosis not present

## 2023-01-31 DIAGNOSIS — J309 Allergic rhinitis, unspecified: Secondary | ICD-10-CM | POA: Diagnosis not present

## 2023-01-31 DIAGNOSIS — Z91018 Allergy to other foods: Secondary | ICD-10-CM | POA: Diagnosis not present

## 2023-01-31 DIAGNOSIS — M1990 Unspecified osteoarthritis, unspecified site: Secondary | ICD-10-CM | POA: Diagnosis not present

## 2023-01-31 DIAGNOSIS — G8929 Other chronic pain: Secondary | ICD-10-CM | POA: Diagnosis not present

## 2023-01-31 DIAGNOSIS — K219 Gastro-esophageal reflux disease without esophagitis: Secondary | ICD-10-CM | POA: Diagnosis not present

## 2023-01-31 DIAGNOSIS — K52832 Lymphocytic colitis: Secondary | ICD-10-CM | POA: Diagnosis not present

## 2023-01-31 DIAGNOSIS — E7800 Pure hypercholesterolemia, unspecified: Secondary | ICD-10-CM | POA: Diagnosis not present

## 2023-01-31 DIAGNOSIS — M810 Age-related osteoporosis without current pathological fracture: Secondary | ICD-10-CM | POA: Diagnosis not present

## 2023-01-31 DIAGNOSIS — Z0000 Encounter for general adult medical examination without abnormal findings: Secondary | ICD-10-CM | POA: Diagnosis not present

## 2023-02-01 DIAGNOSIS — M5416 Radiculopathy, lumbar region: Secondary | ICD-10-CM | POA: Diagnosis not present

## 2023-03-27 DIAGNOSIS — K52832 Lymphocytic colitis: Secondary | ICD-10-CM | POA: Diagnosis not present

## 2023-03-27 DIAGNOSIS — R194 Change in bowel habit: Secondary | ICD-10-CM | POA: Diagnosis not present

## 2023-03-27 DIAGNOSIS — Z86010 Personal history of colonic polyps: Secondary | ICD-10-CM | POA: Diagnosis not present

## 2023-03-27 DIAGNOSIS — K580 Irritable bowel syndrome with diarrhea: Secondary | ICD-10-CM | POA: Diagnosis not present

## 2023-04-09 ENCOUNTER — Encounter (INDEPENDENT_AMBULATORY_CARE_PROVIDER_SITE_OTHER): Payer: 59 | Admitting: Ophthalmology

## 2023-04-18 DIAGNOSIS — D692 Other nonthrombocytopenic purpura: Secondary | ICD-10-CM | POA: Diagnosis not present

## 2023-05-14 ENCOUNTER — Ambulatory Visit (INDEPENDENT_AMBULATORY_CARE_PROVIDER_SITE_OTHER): Payer: 59

## 2023-05-14 ENCOUNTER — Encounter: Payer: Self-pay | Admitting: Podiatry

## 2023-05-14 ENCOUNTER — Ambulatory Visit (INDEPENDENT_AMBULATORY_CARE_PROVIDER_SITE_OTHER): Payer: 59 | Admitting: Podiatry

## 2023-05-14 DIAGNOSIS — M779 Enthesopathy, unspecified: Secondary | ICD-10-CM | POA: Diagnosis not present

## 2023-05-14 DIAGNOSIS — M84375A Stress fracture, left foot, initial encounter for fracture: Secondary | ICD-10-CM | POA: Diagnosis not present

## 2023-05-14 NOTE — Progress Notes (Signed)
Subjective:   Patient ID: Samantha Joseph, female   DOB: 82 y.o.   MRN: [id number]   HPI Patient presents with quite a bit of discomfort in the left foot states it was better for a few months and over the last month it started to become painful   ROS      Objective:  Physical Exam  Neurovascular status intact history of stress fracture second metatarsal left quite a bit of discomfort in the third metatarsal shaft left with swelling of a mild to moderate nature     Assessment:  Possibility for stress fracture third metatarsal left after having stress fracture second     Plan:  H&P reviewed at this point I have recommended continued compression ice did discuss the probability of a stress fracture third metatarsal left but I do not think it will be as bad and I discussed rigid bottom shoes.  Reappoint for [place] to recheck if symptoms persist in the next 4 to 6 weeks  X-rays indicate that there is some suspicion around the third metatarsal shaft left with severe fracture of the second metatarsal left that he will but elevated

## 2023-05-17 DIAGNOSIS — M7138 Other bursal cyst, other site: Secondary | ICD-10-CM | POA: Diagnosis not present

## 2023-05-17 DIAGNOSIS — M5416 Radiculopathy, lumbar region: Secondary | ICD-10-CM | POA: Diagnosis not present

## 2023-05-23 ENCOUNTER — Other Ambulatory Visit: Payer: Self-pay | Admitting: Internal Medicine

## 2023-05-23 DIAGNOSIS — Z1231 Encounter for screening mammogram for malignant neoplasm of breast: Secondary | ICD-10-CM

## 2023-06-28 DIAGNOSIS — M5416 Radiculopathy, lumbar region: Secondary | ICD-10-CM | POA: Diagnosis not present

## 2023-06-28 DIAGNOSIS — M4316 Spondylolisthesis, lumbar region: Secondary | ICD-10-CM | POA: Diagnosis not present

## 2023-06-28 DIAGNOSIS — M7138 Other bursal cyst, other site: Secondary | ICD-10-CM | POA: Diagnosis not present

## 2023-06-29 ENCOUNTER — Ambulatory Visit (INDEPENDENT_AMBULATORY_CARE_PROVIDER_SITE_OTHER): Payer: 59

## 2023-06-29 ENCOUNTER — Encounter: Payer: Self-pay | Admitting: Podiatry

## 2023-06-29 ENCOUNTER — Ambulatory Visit (INDEPENDENT_AMBULATORY_CARE_PROVIDER_SITE_OTHER): Payer: 59 | Admitting: Podiatry

## 2023-06-29 VITALS — BP 147/73 | HR 61

## 2023-06-29 DIAGNOSIS — M84375D Stress fracture, left foot, subsequent encounter for fracture with routine healing: Secondary | ICD-10-CM

## 2023-06-29 MED ORDER — MELOXICAM 15 MG PO TABS: 15.0000 mg | ORAL_TABLET | Freq: Every day | ORAL | 2 refills | Status: DC

## 2023-06-29 NOTE — Progress Notes (Signed)
Subjective:   Patient ID: Samantha Joseph, female   DOB: 82 y.o.   MRN: [id number]   HPI Patient states she did have a good 3 months but over the last 6 7 weeks she still is having a lot of pain in her left foot and it seems to be in a different area   ROS      Objective:  Physical Exam  Neurovascular status intact negative Denna Haggard' sign noted left midfoot around the third metatarsal shaft is very tender mildly into the fourth metatarsal second metatarsal is healed but it is healed in a lifted position of the head secondary to neck fracture     Assessment:  Probability for stress reaction fracture of the third metatarsal left possible partial fourth metatarsal left     Plan:  H&P reviewed condition x-ray and at this point I have advised this patient on stiff bottom shoes placed on oral anti-inflammatory discussed possibility for bone stimulator if symptoms persist and reappoint in 1 month.  Answered a lot of questions concerning condition  X-rays do indicate there is reactivity along the shaft of the third metatarsal fourth metatarsal medial side third being worse

## 2023-07-03 ENCOUNTER — Other Ambulatory Visit: Payer: Self-pay | Admitting: Neurological Surgery

## 2023-07-18 ENCOUNTER — Other Ambulatory Visit (HOSPITAL_BASED_OUTPATIENT_CLINIC_OR_DEPARTMENT_OTHER): Payer: Self-pay

## 2023-07-18 MED ORDER — INFLUENZA VAC A&B SURF ANT ADJ 0.5 ML IM SUSY
0.5000 mL | PREFILLED_SYRINGE | Freq: Once | INTRAMUSCULAR | 0 refills | Status: AC
  Filled 2023-07-18: qty 0.5, 1d supply, fill #0

## 2023-07-18 MED ORDER — COVID-19 MRNA VAC-TRIS(PFIZER) 30 MCG/0.3ML IM SUSY
0.3000 mL | PREFILLED_SYRINGE | Freq: Once | INTRAMUSCULAR | 0 refills | Status: AC
  Filled 2023-07-18: qty 0.3, 1d supply, fill #0

## 2023-08-03 ENCOUNTER — Ambulatory Visit: Payer: 59 | Admitting: Podiatry

## 2023-08-03 ENCOUNTER — Ambulatory Visit: Payer: 59

## 2023-08-06 ENCOUNTER — Ambulatory Visit (INDEPENDENT_AMBULATORY_CARE_PROVIDER_SITE_OTHER): Payer: 59

## 2023-08-06 ENCOUNTER — Ambulatory Visit (INDEPENDENT_AMBULATORY_CARE_PROVIDER_SITE_OTHER): Payer: 59 | Admitting: Podiatry

## 2023-08-06 ENCOUNTER — Encounter: Payer: Self-pay | Admitting: Podiatry

## 2023-08-06 VITALS — Ht 60.0 in | Wt 105.0 lb

## 2023-08-06 DIAGNOSIS — M84375D Stress fracture, left foot, subsequent encounter for fracture with routine healing: Secondary | ICD-10-CM

## 2023-08-08 DIAGNOSIS — K219 Gastro-esophageal reflux disease without esophagitis: Secondary | ICD-10-CM | POA: Diagnosis not present

## 2023-08-08 DIAGNOSIS — K52832 Lymphocytic colitis: Secondary | ICD-10-CM | POA: Diagnosis not present

## 2023-08-08 DIAGNOSIS — E7800 Pure hypercholesterolemia, unspecified: Secondary | ICD-10-CM | POA: Diagnosis not present

## 2023-08-08 DIAGNOSIS — Z91018 Allergy to other foods: Secondary | ICD-10-CM | POA: Diagnosis not present

## 2023-08-08 DIAGNOSIS — M51379 Other intervertebral disc degeneration, lumbosacral region without mention of lumbar back pain or lower extremity pain: Secondary | ICD-10-CM | POA: Diagnosis not present

## 2023-08-08 DIAGNOSIS — M810 Age-related osteoporosis without current pathological fracture: Secondary | ICD-10-CM | POA: Diagnosis not present

## 2023-08-08 DIAGNOSIS — G8929 Other chronic pain: Secondary | ICD-10-CM | POA: Diagnosis not present

## 2023-08-08 NOTE — Progress Notes (Signed)
Subjective:   Patient ID: Samantha Joseph, female   DOB: 82 y.o.   MRN: [id number]   HPI Patient continues to get pain on top of her feet and states that the right 1 is now bothering her like the left 1   ROS      Objective:  Physical Exam  Neurovascular status intact history of stress fracture second metatarsal left moderately severe with residual inflammation bilateral     Assessment:  Difficult to say if this is still residual inflammation from the stress fracture or just generalized inflammatory process     Plan:  H&P reviewed all conditions recommended ice therapy which was accomplished and shoe gear modifications but do not see any more aggressive treatment currently that we can do.  If symptoms persist may have to consider something else  X-rays indicate severe fracture second metatarsal distal shaft that is healed with possibility for low-grade stress for a stress riser of the third and fourth metatarsal left

## 2023-08-10 ENCOUNTER — Ambulatory Visit
Admission: RE | Admit: 2023-08-10 | Discharge: 2023-08-10 | Disposition: A | Discharge status: Home / Self Care | Payer: 59 | Source: Ambulatory Visit | Attending: Internal Medicine | Admitting: Internal Medicine

## 2023-08-10 DIAGNOSIS — Z1231 Encounter for screening mammogram for malignant neoplasm of breast: Secondary | ICD-10-CM

## 2023-08-22 ENCOUNTER — Encounter (HOSPITAL_COMMUNITY): Payer: Self-pay

## 2023-08-22 NOTE — Progress Notes (Signed)
Surgical Instructions    Your procedure is scheduled on Wednesday, 09/05/23..  Report to [facility] Main Entrance "A" at 6:30 A.M., then check in with the Admitting office.  Call this number if you have problems the morning of surgery:  [phone]   If you have any questions prior to your surgery date call [phone]: Open Monday-Friday 8am-4pm If you experience any cold or flu symptoms such as cough, fever, chills, shortness of breath, etc. between now and your scheduled surgery, please notify us at the above number     Remember:  Do not eat or drink after midnight the night before your surgery- Tuesday.     Take these medicines the morning of surgery with A SIP OF WATER:  traMADol (ULTRAM) if needed   As of today, STOP taking any Aspirin (unless otherwise instructed by your surgeon) Aleve, Naproxen, Ibuprofen, Motrin, Advil, Goody's, BC's, all herbal medications, fish oil, and all vitamins.           Do not wear jewelry or makeup. Do not wear lotions, powders, perfumes or deodorant. Do not shave 48 hours prior to surgery.   Do not bring valuables to the hospital. Do not wear nail polish, gel polish, artificial nails, or any other type of covering on natural nails (fingers and toes) If you have artificial nails or gel coating that need to be removed by a nail salon, please have this removed prior to surgery. Artificial nails or gel coating may interfere with anesthesia's ability to adequately monitor your vital signs.  [facility] is not responsible for any belongings or valuables.    Contacts, glasses, hearing aids, dentures or partials may not be worn into surgery, please bring cases for these belongings   For patients admitted to the hospital, discharge time will be determined by your treatment team.    SURGICAL WAITING ROOM VISITATION Patients having surgery or a procedure may have no more than 2 support people in the waiting area - these visitors may rotate.   Children  under the age of 27 must have an adult with them who is not the patient. If the patient needs to stay at the hospital during part of their recovery, the visitor guidelines for inpatient rooms apply. Pre-op nurse will coordinate an appropriate time for 1 support person to accompany patient in pre-op.  This support person may not rotate.   Please refer to [email or web address] for the visitor guidelines for Inpatients (after your surgery is over and you are in a regular room).    Special instructions:    Oral Hygiene is also important to reduce your risk of infection.  Remember - BRUSH YOUR TEETH THE MORNING OF SURGERY WITH YOUR REGULAR TOOTHPASTE   [facility]- Preparing For Surgery  Before surgery, you can play an important role. Because skin is not sterile, your skin needs to be as free of germs as possible. You can reduce the number of germs on your skin by washing with CHG (chlorahexidine gluconate) Soap before surgery.  CHG is an antiseptic cleaner which kills germs and bonds with the skin to continue killing germs even after washing.     Please do not use if you have an allergy to CHG or antibacterial soaps. If your skin becomes reddened/irritated stop using the CHG.  Do not shave (including legs and underarms) for at least 48 hours prior to first CHG shower. It is OK to shave your face.  Please follow these instructions carefully.     Shower the  NIGHT BEFORE SURGERY-Tues and the MORNING OF SURGERY-Wed with CHG Soap.   If you chose to wash your hair, wash your hair first as usual with your normal shampoo. After you shampoo, rinse your hair and body thoroughly to remove the shampoo.  Then [employer] and genitals (private parts) with your normal soap and rinse thoroughly to remove soap.  After that Use CHG Soap as you would any other liquid soap. You can apply CHG directly to the skin and wash gently with a scrungie or a clean washcloth.    Apply the CHG Soap to your body ONLY FROM THE NECK DOWN.  Do not use on open wounds or open sores. Avoid contact with your eyes, ears, mouth and genitals (private parts). Wash Face and genitals (private parts)  with your normal soap.   Wash thoroughly, paying special attention to the area where your surgery will be performed.  Thoroughly rinse your body with warm water from the neck down.  DO NOT shower/wash with your normal soap after using and rinsing off the CHG Soap.  Pat yourself dry with a CLEAN TOWEL.  Wear CLEAN PAJAMAS to bed the night before surgery  Place CLEAN SHEETS on your bed the night before your surgery  DO NOT SLEEP WITH PETS.   Day of Surgery:  Take a shower with CHG soap. Wear Clean/Comfortable clothing the morning of surgery Do not apply any deodorants/lotions.   Remember to brush your teeth WITH YOUR REGULAR TOOTHPASTE.    If you received a COVID test during your pre-op visit, it is requested that you wear a mask when out in public, stay away from anyone that may not be feeling well, and notify your surgeon if you develop symptoms. If you have been in contact with anyone that has tested positive in the last 10 days, please notify your surgeon.    Please read over the following fact sheets that you were given.

## 2023-08-22 NOTE — Progress Notes (Signed)
PCP - Dr Ardean Larsen  Cardiologist - none  Chest x-ray - n/a EKG - n/a Stress Test - n/a ECHO - n/a Cardiac Cath - n/a  ICD Pacemaker/Loop - n/a  Sleep Study -  n/a  Diabetes - n/a  NPO  STOP now taking any Aspirin (unless otherwise instructed by your surgeon), Aleve, Naproxen, Ibuprofen, Motrin, Mobic, Advil, Goody's, BC's, all herbal medications, fish oil, and all vitamins.   Coronavirus Screening Do you have any of the following symptoms:  Cough yes/no: No Fever (>100.94F)  yes/no: No Runny nose yes/no: No Sore throat yes/no: No Difficulty breathing/shortness of breath  yes/no: No  Have you traveled in the last 14 days and where? yes/no: No  Patient verbalized understanding of instructions that were given to them at the PAT appointment. Patient was also instructed that they will need to review over the PAT instructions again at home before surgery.

## 2023-08-23 ENCOUNTER — Encounter (HOSPITAL_COMMUNITY)
Admission: RE | Admit: 2023-08-23 | Discharge: 2023-08-23 | Disposition: A | Discharge status: Home / Self Care | Payer: 59 | Source: Ambulatory Visit | Attending: Neurological Surgery | Admitting: Neurological Surgery

## 2023-08-23 ENCOUNTER — Other Ambulatory Visit: Payer: Self-pay

## 2023-08-23 ENCOUNTER — Encounter (HOSPITAL_COMMUNITY): Payer: Self-pay

## 2023-08-23 VITALS — BP 151/84 | HR 58 | Temp 98.7°F | Resp 16 | Ht 60.0 in | Wt 102.7 lb

## 2023-08-23 DIAGNOSIS — Z01812 Encounter for preprocedural laboratory examination: Secondary | ICD-10-CM | POA: Diagnosis present

## 2023-08-23 DIAGNOSIS — Z01818 Encounter for other preprocedural examination: Secondary | ICD-10-CM

## 2023-08-23 HISTORY — DX: Unspecified osteoarthritis, unspecified site: M19.90

## 2023-08-23 HISTORY — DX: Hyperlipidemia, unspecified: E78.5

## 2023-08-23 LAB — CBC
HCT: 42.2 % (ref 36.0–46.0)
Hemoglobin: 14 g/dL (ref 12.0–15.0)
MCH: 31.7 pg (ref 26.0–34.0)
MCHC: 33.2 g/dL (ref 30.0–36.0)
MCV: 95.7 fL (ref 80.0–100.0)
Platelets: 212 10*3/uL (ref 150–400)
RBC: 4.41 MIL/uL (ref 3.87–5.11)
RDW: 12.3 % (ref 11.5–15.5)
WBC: 6.7 10*3/uL (ref 4.0–10.5)
nRBC: 0 % (ref 0.0–0.2)

## 2023-08-23 LAB — SURGICAL PCR SCREEN
MRSA, PCR: NEGATIVE
Staphylococcus aureus: NEGATIVE

## 2023-08-23 NOTE — Progress Notes (Signed)
Samantha Joseph from Lab called to inform me that there was not enough blood in PT tube and will need to collect PT on DOS.  STAT PT placed in EPIC for DOS.

## 2023-09-04 NOTE — Anesthesia Preprocedure Evaluation (Addendum)
Anesthesia Evaluation  Patient identified by MRN, date of birth, ID band Patient awake    Reviewed: Allergy & Precautions, H&P , NPO status , Patient's Chart, lab work & pertinent test results  Airway Mallampati: II  TM Distance: >3 FB Neck ROM: Full    Dental no notable dental hx. (+) Teeth Intact, Dental Advisory Given   Pulmonary neg pulmonary ROS   Pulmonary exam normal breath sounds clear to auscultation       Cardiovascular Exercise Tolerance: Good negative cardio ROS  Rhythm:Regular Rate:Normal     Neuro/Psych negative neurological ROS  negative psych ROS   GI/Hepatic negative GI ROS, Neg liver ROS,,,  Endo/Other  negative endocrine ROS    Renal/GU negative Renal ROS  negative genitourinary   Musculoskeletal  (+) Arthritis , Osteoarthritis,    Abdominal   Peds  Hematology negative hematology ROS (+)   Anesthesia Other Findings   Reproductive/Obstetrics negative OB ROS                             Anesthesia Physical Anesthesia Plan  ASA: 2  Anesthesia Plan: General   Post-op Pain Management: Tylenol PO (pre-op)*   Induction: Intravenous  PONV Risk Score and Plan: 4 or greater and Ondansetron, Dexamethasone and Treatment may vary due to age or medical condition  Airway Management Planned: Oral ETT  Additional Equipment:   Intra-op Plan:   Post-operative Plan: Extubation in OR  Informed Consent: I have reviewed the patients History and Physical, chart, labs and discussed the procedure including the risks, benefits and alternatives for the proposed anesthesia with the patient or authorized representative who has indicated his/her understanding and acceptance.     Dental advisory given  Plan Discussed with: CRNA  Anesthesia Plan Comments:        Anesthesia Quick Evaluation

## 2023-09-05 ENCOUNTER — Encounter (HOSPITAL_COMMUNITY)
Admission: RE | Disposition: A | Discharge status: Home / Self Care | Payer: Self-pay | Source: Ambulatory Visit | Attending: Neurological Surgery

## 2023-09-05 ENCOUNTER — Other Ambulatory Visit: Payer: Self-pay

## 2023-09-05 ENCOUNTER — Encounter (HOSPITAL_COMMUNITY): Payer: Self-pay | Admitting: Neurological Surgery

## 2023-09-05 ENCOUNTER — Ambulatory Visit (HOSPITAL_COMMUNITY): Payer: 59

## 2023-09-05 ENCOUNTER — Ambulatory Visit (HOSPITAL_COMMUNITY): Payer: 59 | Admitting: Anesthesiology

## 2023-09-05 ENCOUNTER — Ambulatory Visit (HOSPITAL_BASED_OUTPATIENT_CLINIC_OR_DEPARTMENT_OTHER): Payer: 59 | Admitting: Anesthesiology

## 2023-09-05 ENCOUNTER — Observation Stay (HOSPITAL_COMMUNITY)
Admission: RE | Admit: 2023-09-05 | Discharge: 2023-09-06 | Disposition: A | Discharge status: Home / Self Care | Payer: 59 | Source: Ambulatory Visit | Attending: Neurological Surgery | Admitting: Neurological Surgery

## 2023-09-05 DIAGNOSIS — M4316 Spondylolisthesis, lumbar region: Secondary | ICD-10-CM | POA: Diagnosis not present

## 2023-09-05 DIAGNOSIS — E785 Hyperlipidemia, unspecified: Secondary | ICD-10-CM | POA: Diagnosis not present

## 2023-09-05 DIAGNOSIS — Z791 Long term (current) use of non-steroidal anti-inflammatories (NSAID): Secondary | ICD-10-CM | POA: Diagnosis not present

## 2023-09-05 DIAGNOSIS — Z1231 Encounter for screening mammogram for malignant neoplasm of breast: Secondary | ICD-10-CM | POA: Insufficient documentation

## 2023-09-05 DIAGNOSIS — Z79899 Other long term (current) drug therapy: Secondary | ICD-10-CM | POA: Insufficient documentation

## 2023-09-05 DIAGNOSIS — M7138 Other bursal cyst, other site: Secondary | ICD-10-CM | POA: Insufficient documentation

## 2023-09-05 DIAGNOSIS — Z7952 Long term (current) use of systemic steroids: Secondary | ICD-10-CM | POA: Insufficient documentation

## 2023-09-05 DIAGNOSIS — Z01818 Encounter for other preprocedural examination: Secondary | ICD-10-CM

## 2023-09-05 DIAGNOSIS — M5416 Radiculopathy, lumbar region: Secondary | ICD-10-CM | POA: Insufficient documentation

## 2023-09-05 DIAGNOSIS — Z9889 Other specified postprocedural states: Principal | ICD-10-CM

## 2023-09-05 DIAGNOSIS — Z0189 Encounter for other specified special examinations: Secondary | ICD-10-CM | POA: Diagnosis not present

## 2023-09-05 HISTORY — PX: LAMINECTOMY WITH POSTERIOR LATERAL ARTHRODESIS LEVEL 1: SHX6335

## 2023-09-05 LAB — PROTIME-INR
INR: 0.9 (ref 0.8–1.2)
Prothrombin Time: 12.7 s (ref 11.4–15.2)

## 2023-09-05 SURGERY — LAMINECTOMY WITH POSTERIOR LATERAL ARTHRODESIS LEVEL 1
Anesthesia: General | Site: Back | Laterality: Left

## 2023-09-05 MED ORDER — LIDOCAINE 2% (20 MG/ML) 5 ML SYRINGE
INTRAMUSCULAR | Status: DC | PRN
  Administered 2023-09-05: 60 mg via INTRAVENOUS

## 2023-09-05 MED ORDER — ONDANSETRON HCL 4 MG/2ML IJ SOLN: 4.0000 mg | Freq: Four times a day (QID) | INTRAMUSCULAR | Status: DC | PRN

## 2023-09-05 MED ORDER — CHLORHEXIDINE GLUCONATE CLOTH 2 % EX PADS: 6.0000 | MEDICATED_PAD | Freq: Once | CUTANEOUS | Status: DC

## 2023-09-05 MED ORDER — ROCURONIUM BROMIDE 10 MG/ML (PF) SYRINGE
PREFILLED_SYRINGE | INTRAVENOUS | Status: AC
  Filled 2023-09-05: qty 10

## 2023-09-05 MED ORDER — KCL IN DEXTROSE-NACL 10-5-0.45 MEQ/L-%-% IV SOLN
INTRAVENOUS | Status: AC
  Filled 2023-09-05: qty 1000

## 2023-09-05 MED ORDER — PHENOL 1.4 % MT LIQD: 1.0000 | OROMUCOSAL | Status: DC | PRN

## 2023-09-05 MED ORDER — SUGAMMADEX SODIUM 200 MG/2ML IV SOLN
INTRAVENOUS | Status: DC | PRN
  Administered 2023-09-05: 100 mg via INTRAVENOUS

## 2023-09-05 MED ORDER — PROPOFOL 10 MG/ML IV BOLUS
INTRAVENOUS | Status: DC | PRN
  Administered 2023-09-05: 100 mg via INTRAVENOUS

## 2023-09-05 MED ORDER — GABAPENTIN 300 MG PO CAPS: 300.0000 mg | ORAL_CAPSULE | ORAL | Status: AC

## 2023-09-05 MED ORDER — HYDROCODONE-ACETAMINOPHEN 7.5-325 MG PO TABS
1.0000 | ORAL_TABLET | Freq: Four times a day (QID) | ORAL | Status: DC
  Administered 2023-09-05 – 2023-09-06 (×3): 1 via ORAL
  Filled 2023-09-05 (×3): qty 1

## 2023-09-05 MED ORDER — EPHEDRINE SULFATE-NACL 50-0.9 MG/10ML-% IV SOSY
PREFILLED_SYRINGE | INTRAVENOUS | Status: DC | PRN
  Administered 2023-09-05: 5 mg via INTRAVENOUS

## 2023-09-05 MED ORDER — TRAMADOL HCL 50 MG PO TABS
50.0000 mg | ORAL_TABLET | Freq: Four times a day (QID) | ORAL | Status: DC | PRN
  Administered 2023-09-05 – 2023-09-06 (×2): 50 mg via ORAL
  Filled 2023-09-05 (×2): qty 1

## 2023-09-05 MED ORDER — HEMOSTATIC AGENTS (NO CHARGE) OPTIME
TOPICAL | Status: DC | PRN
  Administered 2023-09-05: 1 via TOPICAL

## 2023-09-05 MED ORDER — BUPIVACAINE HCL (PF) 0.25 % IJ SOLN
INTRAMUSCULAR | Status: AC
  Filled 2023-09-05: qty 30

## 2023-09-05 MED ORDER — THROMBIN 5000 UNITS EX SOLR
OROMUCOSAL | Status: DC | PRN
  Administered 2023-09-05: 5 mL via TOPICAL

## 2023-09-05 MED ORDER — GABAPENTIN 300 MG PO CAPS
ORAL_CAPSULE | ORAL | Status: AC
  Administered 2023-09-05: 300 mg via ORAL
  Filled 2023-09-05: qty 1

## 2023-09-05 MED ORDER — MORPHINE SULFATE (PF) 2 MG/ML IV SOLN: 2.0000 mg | INTRAVENOUS | Status: DC | PRN

## 2023-09-05 MED ORDER — ONDANSETRON HCL 4 MG/2ML IJ SOLN
INTRAMUSCULAR | Status: AC
  Filled 2023-09-05: qty 2

## 2023-09-05 MED ORDER — PROPOFOL 10 MG/ML IV BOLUS
INTRAVENOUS | Status: AC
  Filled 2023-09-05: qty 20

## 2023-09-05 MED ORDER — METHOCARBAMOL 500 MG PO TABS
500.0000 mg | ORAL_TABLET | Freq: Four times a day (QID) | ORAL | Status: DC | PRN
  Administered 2023-09-05: 500 mg via ORAL
  Filled 2023-09-05: qty 1

## 2023-09-05 MED ORDER — SODIUM CHLORIDE 0.9% FLUSH: 3.0000 mL | Freq: Two times a day (BID) | INTRAVENOUS | Status: DC

## 2023-09-05 MED ORDER — PHENYLEPHRINE 80 MCG/ML (10ML) SYRINGE FOR IV PUSH (FOR BLOOD PRESSURE SUPPORT)
PREFILLED_SYRINGE | INTRAVENOUS | Status: DC | PRN
  Administered 2023-09-05 (×5): 80 ug via INTRAVENOUS

## 2023-09-05 MED ORDER — ROCURONIUM BROMIDE 10 MG/ML (PF) SYRINGE
PREFILLED_SYRINGE | INTRAVENOUS | Status: DC | PRN
  Administered 2023-09-05: 60 mg via INTRAVENOUS

## 2023-09-05 MED ORDER — ACETAMINOPHEN 500 MG PO TABS: 1000.0000 mg | ORAL_TABLET | ORAL | Status: AC

## 2023-09-05 MED ORDER — ONDANSETRON HCL 4 MG PO TABS: 4.0000 mg | ORAL_TABLET | Freq: Four times a day (QID) | ORAL | Status: DC | PRN

## 2023-09-05 MED ORDER — FENTANYL CITRATE (PF) 250 MCG/5ML IJ SOLN
INTRAMUSCULAR | Status: AC
  Filled 2023-09-05: qty 5

## 2023-09-05 MED ORDER — FENTANYL CITRATE (PF) 250 MCG/5ML IJ SOLN
INTRAMUSCULAR | Status: DC | PRN
  Administered 2023-09-05 (×4): 50 ug via INTRAVENOUS

## 2023-09-05 MED ORDER — THROMBIN 20000 UNITS EX SOLR
CUTANEOUS | Status: DC | PRN
  Administered 2023-09-05: 20 mL via TOPICAL

## 2023-09-05 MED ORDER — BUPIVACAINE HCL (PF) 0.25 % IJ SOLN
INTRAMUSCULAR | Status: DC | PRN
  Administered 2023-09-05: 9 mL

## 2023-09-05 MED ORDER — CEFAZOLIN SODIUM-DEXTROSE 2-4 GM/100ML-% IV SOLN
INTRAVENOUS | Status: AC
  Filled 2023-09-05: qty 100

## 2023-09-05 MED ORDER — DEXAMETHASONE SODIUM PHOSPHATE 10 MG/ML IJ SOLN
INTRAMUSCULAR | Status: AC
  Filled 2023-09-05: qty 1

## 2023-09-05 MED ORDER — SURGIRINSE WOUND IRRIGATION SYSTEM - OPTIME
TOPICAL | Status: DC | PRN
  Administered 2023-09-05: 450 mL via TOPICAL

## 2023-09-05 MED ORDER — CEFAZOLIN SODIUM-DEXTROSE 2-4 GM/100ML-% IV SOLN
2.0000 g | Freq: Three times a day (TID) | INTRAVENOUS | Status: AC
  Administered 2023-09-05 – 2023-09-06 (×2): 2 g via INTRAVENOUS
  Filled 2023-09-05 (×2): qty 100

## 2023-09-05 MED ORDER — ACETAMINOPHEN 650 MG RE SUPP: 650.0000 mg | RECTAL | Status: DC | PRN

## 2023-09-05 MED ORDER — THROMBIN 5000 UNITS EX SOLR
CUTANEOUS | Status: AC
Start: 2023-09-05 — End: ?
  Filled 2023-09-05: qty 5000

## 2023-09-05 MED ORDER — BUDESONIDE 3 MG PO CPEP
9.0000 mg | ORAL_CAPSULE | Freq: Every morning | ORAL | Status: DC
  Administered 2023-09-05 – 2023-09-06 (×2): 9 mg via ORAL
  Filled 2023-09-05 (×2): qty 3

## 2023-09-05 MED ORDER — SODIUM CHLORIDE 0.9% FLUSH: 3.0000 mL | INTRAVENOUS | Status: DC | PRN

## 2023-09-05 MED ORDER — METHOCARBAMOL 1000 MG/10ML IJ SOLN: 500.0000 mg | Freq: Four times a day (QID) | INTRAMUSCULAR | Status: DC | PRN

## 2023-09-05 MED ORDER — ONDANSETRON HCL 4 MG/2ML IJ SOLN
INTRAMUSCULAR | Status: DC | PRN
  Administered 2023-09-05: 4 mg via INTRAVENOUS

## 2023-09-05 MED ORDER — CEFAZOLIN SODIUM-DEXTROSE 2-4 GM/100ML-% IV SOLN
2.0000 g | INTRAVENOUS | Status: AC
  Administered 2023-09-05: 2 g via INTRAVENOUS

## 2023-09-05 MED ORDER — CHLORHEXIDINE GLUCONATE 0.12 % MT SOLN
OROMUCOSAL | Status: AC
  Administered 2023-09-05: 15 mL via OROMUCOSAL
  Filled 2023-09-05: qty 15

## 2023-09-05 MED ORDER — DEXAMETHASONE SODIUM PHOSPHATE 10 MG/ML IJ SOLN
INTRAMUSCULAR | Status: DC | PRN
  Administered 2023-09-05: 5 mg via INTRAVENOUS

## 2023-09-05 MED ORDER — PHENYLEPHRINE 80 MCG/ML (10ML) SYRINGE FOR IV PUSH (FOR BLOOD PRESSURE SUPPORT)
PREFILLED_SYRINGE | INTRAVENOUS | Status: AC
  Filled 2023-09-05: qty 10

## 2023-09-05 MED ORDER — LIDOCAINE 2% (20 MG/ML) 5 ML SYRINGE
INTRAMUSCULAR | Status: AC
  Filled 2023-09-05: qty 5

## 2023-09-05 MED ORDER — ORAL CARE MOUTH RINSE: 15.0000 mL | Freq: Once | OROMUCOSAL | Status: AC

## 2023-09-05 MED ORDER — MENTHOL 3 MG MT LOZG: 1.0000 | LOZENGE | OROMUCOSAL | Status: DC | PRN

## 2023-09-05 MED ORDER — SENNA 8.6 MG PO TABS
1.0000 | ORAL_TABLET | Freq: Two times a day (BID) | ORAL | Status: DC
  Administered 2023-09-05 – 2023-09-06 (×2): 8.6 mg via ORAL
  Filled 2023-09-05 (×2): qty 1

## 2023-09-05 MED ORDER — LACTATED RINGERS IV SOLN
INTRAVENOUS | Status: DC
Start: 2023-09-05 — End: 2023-09-05

## 2023-09-05 MED ORDER — HYDROMORPHONE HCL 1 MG/ML IJ SOLN: 0.2500 mg | INTRAMUSCULAR | Status: DC | PRN

## 2023-09-05 MED ORDER — SODIUM CHLORIDE 0.9 % IV SOLN: 250.0000 mL | INTRAVENOUS | Status: AC

## 2023-09-05 MED ORDER — LABETALOL HCL 5 MG/ML IV SOLN
INTRAVENOUS | Status: DC | PRN
  Administered 2023-09-05: 5 mg via INTRAVENOUS

## 2023-09-05 MED ORDER — THROMBIN 20000 UNITS EX SOLR
CUTANEOUS | Status: AC
  Filled 2023-09-05: qty 20000

## 2023-09-05 MED ORDER — ACETAMINOPHEN 325 MG PO TABS
650.0000 mg | ORAL_TABLET | ORAL | Status: DC | PRN
  Administered 2023-09-06: 650 mg via ORAL
  Filled 2023-09-05: qty 2

## 2023-09-05 MED ORDER — EPHEDRINE 5 MG/ML INJ
INTRAVENOUS | Status: AC
  Filled 2023-09-05: qty 5

## 2023-09-05 MED ORDER — ACETAMINOPHEN 500 MG PO TABS
ORAL_TABLET | ORAL | Status: AC
  Administered 2023-09-05: 1000 mg via ORAL
  Filled 2023-09-05: qty 2

## 2023-09-05 MED ORDER — CHLORHEXIDINE GLUCONATE 0.12 % MT SOLN: 15.0000 mL | Freq: Once | OROMUCOSAL | Status: AC

## 2023-09-05 SURGICAL SUPPLY — 48 items
BAG COUNTER SPONGE SURGICOUNT (BAG) ×1 IMPLANT
BASKET BONE COLLECTION (BASKET) IMPLANT
BENZOIN TINCTURE PRP APPL 2/3 (GAUZE/BANDAGES/DRESSINGS) ×1 IMPLANT
BLADE BONE MILL MEDIUM (MISCELLANEOUS) IMPLANT
BLADE CLIPPER SURG (BLADE) IMPLANT
BUR CARBIDE MATCH 3.0 (BURR) ×1 IMPLANT
CANISTER SUCT 3000ML PPV (MISCELLANEOUS) ×1 IMPLANT
CNTNR URN SCR LID CUP LEK RST (MISCELLANEOUS) ×1 IMPLANT
COVER BACK TABLE 60X90IN (DRAPES) ×1 IMPLANT
DRAPE C-ARM 42X72 X-RAY (DRAPES) IMPLANT
DRAPE LAPAROTOMY 100X72X124 (DRAPES) ×1 IMPLANT
DRAPE SURG 17X23 STRL (DRAPES) ×1 IMPLANT
DRSG OPSITE POSTOP 3X4 (GAUZE/BANDAGES/DRESSINGS) IMPLANT
DURAPREP 26ML APPLICATOR (WOUND CARE) ×1 IMPLANT
ELECT REM PT RETURN 9FT ADLT (ELECTROSURGICAL) ×1 IMPLANT
ELECTRODE REM PT RTRN 9FT ADLT (ELECTROSURGICAL) ×1 IMPLANT
EVACUATOR 1/8 PVC DRAIN (DRAIN) IMPLANT
GAUZE 4X4 16PLY ~~LOC~~+RFID DBL (SPONGE) IMPLANT
GLOVE BIO SURGEON STRL SZ7 (GLOVE) IMPLANT
GLOVE BIO SURGEON STRL SZ8 (GLOVE) ×2 IMPLANT
GLOVE BIOGEL PI IND STRL 7.0 (GLOVE) IMPLANT
GOWN STRL REUS W/ TWL LRG LVL3 (GOWN DISPOSABLE) IMPLANT
GOWN STRL REUS W/ TWL XL LVL3 (GOWN DISPOSABLE) ×2 IMPLANT
GOWN STRL REUS W/TWL 2XL LVL3 (GOWN DISPOSABLE) IMPLANT
GRAFT BN 5X1XSPNE CVD POST DBM (Bone Implant) IMPLANT
HEMOSTAT POWDER KIT SURGIFOAM (HEMOSTASIS) ×1 IMPLANT
KIT BASIN OR (CUSTOM PROCEDURE TRAY) ×1 IMPLANT
KIT INFUSE X SMALL 1.4CC (Orthopedic Implant) IMPLANT
KIT TURNOVER KIT B (KITS) ×1 IMPLANT
MILL BONE PREP (MISCELLANEOUS) IMPLANT
NDL HYPO 25X1 1.5 SAFETY (NEEDLE) ×1 IMPLANT
NEEDLE HYPO 25X1 1.5 SAFETY (NEEDLE) ×1 IMPLANT
NS IRRIG 1000ML POUR BTL (IV SOLUTION) ×1 IMPLANT
PACK LAMINECTOMY NEURO (CUSTOM PROCEDURE TRAY) ×1 IMPLANT
PAD ARMBOARD 7.5X6 YLW CONV (MISCELLANEOUS) ×3 IMPLANT
PATTIES SURGICAL .5 X.5 (GAUZE/BANDAGES/DRESSINGS) IMPLANT
SEALANT ADHERUS EXTEND TIP (MISCELLANEOUS) IMPLANT
SOLUTION IRRIG SURGIPHOR (IV SOLUTION) ×1 IMPLANT
SPONGE SURGIFOAM ABS GEL 100 (HEMOSTASIS) ×1 IMPLANT
SPONGE T-LAP 4X18 ~~LOC~~+RFID (SPONGE) IMPLANT
STRIP CLOSURE SKIN 1/2X4 (GAUZE/BANDAGES/DRESSINGS) ×2 IMPLANT
SUT PROLENE 6 0 BV (SUTURE) IMPLANT
SUT VIC AB 0 CT1 18XCR BRD8 (SUTURE) ×1 IMPLANT
SUT VIC AB 2-0 CP2 18 (SUTURE) ×1 IMPLANT
SUT VIC AB 3-0 SH 8-18 (SUTURE) ×2 IMPLANT
TOWEL GREEN STERILE (TOWEL DISPOSABLE) ×1 IMPLANT
TOWEL GREEN STERILE FF (TOWEL DISPOSABLE) ×1 IMPLANT
WATER STERILE IRR 1000ML POUR (IV SOLUTION) ×1 IMPLANT

## 2023-09-05 NOTE — Transfer of Care (Signed)
Immediate Anesthesia Transfer of Care Note  Patient: Freddrick March  Procedure(s) Performed: LAMINECTOMY FOR FACET/SYNOVIAL CYST LUMBAR FOUR-FIVE, LEFT, POSTERIORLATERAL ARTHRODESIS  LUMBAR FOUR-FIVE (Left: Back)  Patient Location: PACU  Anesthesia Type:General  Level of Consciousness: awake and alert   Airway & Oxygen Therapy: Patient Spontanous Breathing and Patient connected to face mask oxygen  Post-op Assessment: Report given to RN and Post -op Vital signs reviewed and stable  Post vital signs: Reviewed and stable  Last Vitals:  Vitals Value Taken Time  BP    Temp    Pulse 70 09/05/23 1039  Resp 12 09/05/23 1039  SpO2 88 % 09/05/23 1039  Vitals shown include unfiled device data.  Last Pain:  Vitals:   09/05/23 0707  TempSrc:   PainSc: 4       Patients Stated Pain Goal: 1 (09/05/23 0707)  Complications: No notable events documented.

## 2023-09-05 NOTE — Progress Notes (Signed)
CSW received call from Hiram Gash at [facility] inquiring about patient. Raoul Pitch will return call later to determine when the patient will be returning to the facility.  Edwin Dada, MSW, LCSW Transitions of Care  Clinical Social Worker II [phone]

## 2023-09-05 NOTE — Anesthesia Procedure Notes (Signed)
Procedure Name: Intubation Date/Time: 09/05/2023 8:35 AM  Performed by: Thomasene Ripple, CRNAPre-anesthesia Checklist: Patient identified, Emergency Drugs available, Suction available and Patient being monitored Patient Re-evaluated:Patient Re-evaluated prior to induction Oxygen Delivery Method: Circle System Utilized Preoxygenation: Pre-oxygenation with 100% oxygen Induction Type: IV induction Ventilation: Mask ventilation without difficulty and Oral airway inserted - appropriate to patient size Laryngoscope Size: Hyacinth Meeker and 2 Grade View: Grade I Tube type: Oral Tube size: 7.0 mm Number of attempts: 1 Airway Equipment and Method: Stylet and Oral airway Placement Confirmation: ETT inserted through vocal cords under direct vision, positive ETCO2 and breath sounds checked- equal and bilateral Secured at: 21 cm Tube secured with: Tape Dental Injury: Teeth and Oropharynx as per pre-operative assessment

## 2023-09-05 NOTE — Progress Notes (Signed)
Looks good postop.  No leg pain.  No headache.  We will keep her flat overnight.  She has good dorsiflexion.

## 2023-09-05 NOTE — Anesthesia Postprocedure Evaluation (Signed)
Anesthesia Post Note  Patient: Samantha Joseph  Procedure(s) Performed: LAMINECTOMY FOR FACET/SYNOVIAL CYST LUMBAR FOUR-FIVE, LEFT, POSTERIORLATERAL ARTHRODESIS  LUMBAR FOUR-FIVE (Left: Back)     Patient location during evaluation: PACU Anesthesia Type: General Level of consciousness: awake and alert Pain management: pain level controlled Vital Signs Assessment: post-procedure vital signs reviewed and stable Respiratory status: spontaneous breathing, nonlabored ventilation and respiratory function stable Cardiovascular status: blood pressure returned to baseline and stable Postop Assessment: no apparent nausea or vomiting Anesthetic complications: no  No notable events documented.  Last Vitals:  Vitals:   09/05/23 1140 09/05/23 1156  BP: (!) 158/99 (!) 153/82  Pulse: 70 72  Resp: 16 18  Temp: 36.5 C 36.5 C  SpO2: 100% 93%    Last Pain:  Vitals:   09/05/23 1200  TempSrc:   PainSc: 0-No pain                 Jakeia Carreras,W. EDMOND

## 2023-09-05 NOTE — H&P (Signed)
Subjective: Patient is a 82 y.o. female admitted for L leg pain. Onset of symptoms was several months ago, gradually worsening since that time.  The pain is rated severe, and is located at the across the lower back and radiates to LLE. The pain is described as aching and occurs intermittently. The symptoms have been progressive. Symptoms are exacerbated by exercise and standing. MRI or CT showed spondylolisthesis with synovial cyst L4-5 L, previous R l4-5 laminectomy   Past Medical History:  Diagnosis Date   Age-related nuclear cataract of both eyes 12/24/2019   Allergy    Arthritis    Glaucoma 04/18/2021   bilateral - in [place]   HLD (hyperlipidemia)     Past Surgical History:  Procedure Laterality Date   BREAST CYST ASPIRATION     x2   COLONOSCOPY     EYE SURGERY Bilateral 2023   cataracts removed   TONSILLECTOMY     TUBAL LIGATION     UPPER GI ENDOSCOPY      Prior to Admission medications   Medication Sig Start Date End Date Taking? Authorizing Provider  acidophilus (RISAQUAD) CAPS capsule Take 1 capsule by mouth daily.   Yes [provider]  atorvastatin (LIPITOR) 10 MG tablet Take 10 mg by mouth at bedtime. 10/19/19  Yes [provider]  budesonide (ENTOCORT EC) 3 MG 24 hr capsule Take 9 mg by mouth every morning.   Yes [provider]  CALCIUM-VITAMIN D PO Take 1 tablet by mouth daily.   Yes [provider]  cholecalciferol (VITAMIN D3) 25 MCG (1000 UNIT) tablet Take 1,000 Units by mouth daily.   Yes [provider]  traMADol (ULTRAM) 50 MG tablet Take 50 mg by mouth every 6 (six) hours as needed for moderate pain (pain score 4-6). 08/07/23  Yes [provider]  EPINEPHrine 0.3 mg/0.3 mL IJ SOAJ injection Inject 0.3 mLs (0.3 mg total) into the muscle once. Use as needed for allergic reaction emergency Patient not taking: Reported on 08/16/2023 12/05/14   Copland, Gwenlyn Found, MD  influenza vaccine adjuvanted (FLUAD QUADRIVALENT) 0.5  ML injection Inject into the muscle. Patient not taking: Reported on 08/16/2023 09/01/22     meloxicam (MOBIC) 15 MG tablet Take 1 tablet (15 mg total) by mouth daily. Patient not taking: Reported on 08/16/2023 06/29/23   Lenn Sink, DPM  RSV vaccine recomb adjuvanted (AREXVY) 120 MCG/0.5ML injection Inject into the muscle. Patient not taking: Reported on 08/16/2023 06/07/22   Judyann Munson, MD   Allergies  Allergen Reactions   Other     Hazelnuts   Sulfa Antibiotics Rash    Social History   Tobacco Use   Smoking status: Never   Smokeless tobacco: Never  Substance Use Topics   Alcohol use: Yes    Alcohol/week: 7.0 standard drinks of alcohol    Types: 7 Standard drinks or equivalent per week    Comment: daily    Family History  Problem Relation Age of Onset   Heart disease Father    Hyperlipidemia Father    Hypertension Father    Breast cancer Cousin      Review of Systems  Positive ROS: neg  All other systems have been reviewed and were otherwise negative with the exception of those mentioned in the HPI and as above.  Objective: Vital signs in last 24 hours: Temp:  [98.5 F (36.9 C)] 98.5 F (36.9 C) (12/04 2956) Pulse Rate:  [69] 69 (12/04 0638) Resp:  [17] 17 (12/04 2130)  BP: (156)/(87) 156/87 (12/04 1610) SpO2:  [98 %] 98 % (12/04 9604) Weight:  [43.5 kg] 43.5 kg (12/04 5409)  General Appearance: Alert, cooperative, no distress, appears stated age Head: Normocephalic, without obvious abnormality, atraumatic Eyes: PERRL, conjunctiva/corneas clear, EOM's intact    Neck: Supple, symmetrical, trachea midline Back: Symmetric, no curvature, ROM normal, no CVA tenderness Lungs:  respirations unlabored Heart: Regular rate and rhythm Abdomen: Soft, non-tender Extremities: Extremities normal, atraumatic, no cyanosis or edema Pulses: 2+ and symmetric all extremities Skin: Skin color, texture, turgor normal, no rashes or lesions  NEUROLOGIC:   Mental status:  Alert and oriented x4,  no aphasia, good attention span, fund of knowledge, and memory Motor Exam - grossly normal Sensory Exam - grossly normal Reflexes: 1= Coordination - grossly normal Gait - grossly normal Balance - grossly normal Cranial Nerves: I: smell Not tested  II: visual acuity  OS: nl    OD: nl  II: visual fields Full to confrontation  II: pupils Equal, round, reactive to light  III,VII: ptosis None  III,IV,VI: extraocular muscles  Full ROM  V: mastication Normal  V: facial light touch sensation  Normal  V,VII: corneal reflex  Present  VII: facial muscle function - upper  Normal  VII: facial muscle function - lower Normal  VIII: hearing Not tested  IX: soft palate elevation  Normal  IX,X: gag reflex Present  XI: trapezius strength  5/5  XI: sternocleidomastoid strength 5/5  XI: neck flexion strength  5/5  XII: tongue strength  Normal    Data Review Lab Results  Component Value Date   WBC 6.7 08/23/2023   HGB 14.0 08/23/2023   HCT 42.2 08/23/2023   MCV 95.7 08/23/2023   PLT 212 08/23/2023   No results found for: "NA", "K", "CL", "CO2", "BUN", "CREATININE", "GLUCOSE" Lab Results  Component Value Date   INR 0.9 09/05/2023    Assessment/Plan:  Estimated body mass index is 18.75 kg/m as calculated from the following:   Height as of this encounter: 5' (1.524 m).   Weight as of this encounter: 43.5 kg. Patient admitted for L L4-5 laminectomy for synovial cyst with non-instrumented fusion. Patient has failed a reasonable attempt at conservative therapy.  I explained the condition and procedure to the patient and answered any questions.  Patient wishes to proceed with procedure as planned. Understands risks/ benefits and typical outcomes of procedure.   Tia Alert 09/05/2023 8:03 AM

## 2023-09-05 NOTE — Op Note (Addendum)
09/05/2023  10:28 AM  PATIENT:  Samantha Joseph  82 y.o. female  PRE-OPERATIVE DIAGNOSIS: Lumbar spondylolisthesis L4-5, synovial cyst L4-5 left, left L5 radiculopathy.  POST-OPERATIVE DIAGNOSIS:  same  PROCEDURE:  1.  Left L4-5 hemilaminectomy medial facetectomy for removal of synovial cyst, 2.  Left intertransverse arthrodesis utilizing locally harvested morselized autologous bone graft and morselized allograft  SURGEON:  Marikay Alar, MD  ASSISTANTS: Verlin Dike, FNP  ANESTHESIA:   General  EBL: 25 ml  Total I/O In: 1000 [I.V.:1000] Out: 25 [Blood:25]  BLOOD ADMINISTERED: none  DRAINS: None  SPECIMEN:  none  Complications: Unintended durotomy at L4-5 on the left  INDICATION FOR PROCEDURE: This patient presented with left leg pain. Imaging showed a spondylolisthesis L4-5 with left synovial cyst, previous right L4-5 synovial cyst resection, L5 nerve root compression. The patient tried conservative measures without relief. Pain was debilitating. Recommended L4-5 hemilaminectomy for resection of the synovial cyst with noninstrumented intertransverse arthrodesis. Patient understood the risks, benefits, and alternatives and potential outcomes and wished to proceed.  PROCEDURE DETAILS: The patient was taken to the operating room and after induction of adequate generalized endotracheal anesthesia, the patient was rolled into the prone position on the Wilson frame and all pressure points were padded. The lumbar region was cleaned and then prepped with DuraPrep and draped in the usual sterile fashion. 5 cc of local anesthesia was injected and then a dorsal midline incision was made and carried down to the lumbo sacral fascia. The fascia was opened and the paraspinous musculature was taken down in a subperiosteal fashion to expose L4-5 on the left. Intraoperative x-ray confirmed my level, and then I used a combination of the high-speed drill and the Kerrison punches to perform a  hemilaminectomy, medial facetectomy, and foraminotomy at L4 5 on the left. The underlying yellow ligament was opened and removed in a piecemeal fashion to expose the underlying dura and exiting nerve root.  While trying to peel yellow ligament off of the dura we created an unintended durotomy on the left at the junction of the yellow ligament and the synovial cyst which was quite adherent to the dura.  I undercut the lateral recess and dissected down until I was medial to and distal to the pedicle. The nerve root was well decompressed.  Tried to resect as much of the synovial cyst off of the lateral edge of the dura as I could.  I did disconnect it from the joint and I could see the disc space. I then palpated with a coronary dilator along the nerve root and into the foramen to assure adequate decompression. I felt no more compression of the nerve root.  We then repaired the unintended durotomy with a running 6-0 Prolene suture.  We checked this with a Valsalva.  I irrigated with 0.5% povidone iodine solution followed by saline solution. Achieved hemostasis with bipolar cautery, lined the dura with a piece of muscle over the durotomy repair, by Tisseel fibrin glue.  We closed the fascia with 0 Vicryl. I closed the subcutaneous tissues with 2-0 Vicryl and the subcuticular tissues with 3-0 Vicryl. The skin was then closed with benzoin and Steri-Strips. The drapes were removed, a sterile dressing was applied.  My nurse practitioner was involved in the exposure, safe retraction of the neural elements, the removal of the synovial cyst and the repair of the durotomy and the closure.. the patient was awakened from general anesthesia and transferred to the recovery room in stable condition. At the end  of the procedure all sponge, needle and instrument counts were correct.    PLAN OF CARE: Admit for overnight observation  PATIENT DISPOSITION:  PACU - hemodynamically stable.   Delay start of Pharmacological VTE agent  (>24hrs) due to surgical blood loss or risk of bleeding:  yes

## 2023-09-06 ENCOUNTER — Other Ambulatory Visit (HOSPITAL_COMMUNITY): Payer: Self-pay

## 2023-09-06 ENCOUNTER — Encounter (HOSPITAL_COMMUNITY): Payer: Self-pay | Admitting: Neurological Surgery

## 2023-09-06 DIAGNOSIS — M4316 Spondylolisthesis, lumbar region: Secondary | ICD-10-CM | POA: Diagnosis not present

## 2023-09-06 MED ORDER — METHOCARBAMOL 750 MG PO TABS
750.0000 mg | ORAL_TABLET | Freq: Four times a day (QID) | ORAL | 0 refills | Status: AC
  Filled 2023-09-06: qty 45, 12d supply, fill #0

## 2023-09-06 MED ORDER — HYDROCODONE-ACETAMINOPHEN 7.5-325 MG PO TABS
1.0000 | ORAL_TABLET | Freq: Four times a day (QID) | ORAL | 0 refills | Status: AC
  Filled 2023-09-06: qty 30, 7d supply, fill #0

## 2023-09-06 NOTE — Discharge Summary (Signed)
Physician Discharge Summary  Patient ID: Samantha Joseph MRN: [id number] DOB/AGE: [birth year] 82 y.o.  Admit date: 09/05/2023 Discharge date: 09/06/2023  Admission Diagnoses:  Lumbar spondylolisthesis L4-5, synovial cyst L4-5 left, left L5 radiculopathy.     Discharge Diagnoses: same   Discharged Condition: good  Hospital Course: The patient was admitted on 09/05/2023 and taken to the operating room where the patient underwent lumbar laminectomy left L4-5 with posterior lateral fusion. The patient tolerated the procedure well and was taken to the recovery room and then to the floor in stable condition. The hospital course was routine. There were no complications. The wound remained clean dry and intact. Pt had appropriate back soreness. No complaints of leg pain or new N/T/W. The patient remained afebrile with stable vital signs, and tolerated a regular diet. The patient continued to increase activities, and pain was well controlled with oral pain medications.   Consults: None  Significant Diagnostic Studies:  Results for orders placed or performed during the hospital encounter of 09/05/23  Protime-INR  Result Value Ref Range   Prothrombin Time 12.7 11.4 - 15.2 seconds   INR 0.9 0.8 - 1.2    DG Lumbar Spine 1 View  Result Date: 09/05/2023 CLINICAL DATA:  Laminectomy EXAM: LUMBAR SPINE - 1 VIEW COMPARISON:  06/05/2023 lumbar MRI FINDINGS: Standard lumbar numbering based on prior MRI. Retractor and probe projects at the posterior L4-5 level. Additional instrument with tip over the posterior and superior corner of L1. No unexpected finding in the osseous structures. IMPRESSION: Intraoperative localization as described. Electronically Signed   By: Tiburcio Pea M.D.   On: 09/05/2023 19:16   MM 3D SCREENING MAMMOGRAM BILATERAL BREAST  Result Date: 08/14/2023 CLINICAL DATA:  Screening. EXAM: DIGITAL SCREENING BILATERAL MAMMOGRAM WITH TOMOSYNTHESIS AND CAD TECHNIQUE: Bilateral screening  digital craniocaudal and mediolateral oblique mammograms were obtained. Bilateral screening digital breast tomosynthesis was performed. The images were evaluated with computer-aided detection. COMPARISON:  Previous exam(s). ACR Breast Density Category d: The breasts are extremely dense, which lowers the sensitivity of mammography. FINDINGS: There are no findings suspicious for malignancy. IMPRESSION: No mammographic evidence of malignancy. A result letter of this screening mammogram will be mailed directly to the patient. RECOMMENDATION: Screening mammogram in one year. (Code:SM-B-01Y) BI-RADS CATEGORY  1: Negative. Electronically Signed   By: Ted Mcalpine M.D.   On: 08/14/2023 08:42    Antibiotics:  Anti-infectives (From admission, onward)    Start     Dose/Rate Route Frequency Ordered Stop   09/05/23 1630  ceFAZolin (ANCEF) IVPB 2g/100 mL premix        2 g 200 mL/hr over 30 Minutes Intravenous Every 8 hours 09/05/23 1147 09/06/23 0726   09/05/23 0700  ceFAZolin (ANCEF) IVPB 2g/100 mL premix        2 g 200 mL/hr over 30 Minutes Intravenous On call to O.R. 09/05/23 4098 09/05/23 0837   09/05/23 0613  ceFAZolin (ANCEF) 2-4 GM/100ML-% IVPB       Note to Pharmacy: Patsi Sears E: cabinet override      09/05/23 0613 09/05/23 0914       Discharge Exam: Blood pressure 106/71, pulse 61, temperature 98.8 F (37.1 C), temperature source Oral, resp. rate 16, height 5' (1.524 m), weight 43.5 kg, SpO2 96%. Neurologic: Grossly normal Ambulating and voiding well incision cdi   Discharge Medications:   Allergies as of 09/06/2023       Reactions   Other    Hazelnuts   Sulfa Antibiotics Rash  Medication List     STOP taking these medications    Arexvy 120 MCG/0.5ML injection Generic drug: RSV vaccine recomb adjuvanted   Fluad Quadrivalent 0.5 ML injection Generic drug: influenza vaccine adjuvanted   meloxicam 15 MG tablet Commonly known as: MOBIC   traMADol 50 MG  tablet Commonly known as: ULTRAM       TAKE these medications    acidophilus Caps capsule Take 1 capsule by mouth daily.   atorvastatin 10 MG tablet Commonly known as: LIPITOR Take 10 mg by mouth at bedtime.   budesonide 3 MG 24 hr capsule Commonly known as: ENTOCORT EC Take 9 mg by mouth every morning.   CALCIUM-VITAMIN D PO Take 1 tablet by mouth daily.   cholecalciferol 25 MCG (1000 UNIT) tablet Commonly known as: VITAMIN D3 Take 1,000 Units by mouth daily.   EPINEPHrine 0.3 mg/0.3 mL Soaj injection Commonly known as: EPI-PEN Inject 0.3 mLs (0.3 mg total) into the muscle once. Use as needed for allergic reaction emergency   HYDROcodone-acetaminophen 7.5-325 MG tablet Commonly known as: NORCO Take 1 tablet by mouth every 6 (six) hours.   methocarbamol 750 MG tablet Commonly known as: Robaxin-750 Take 1 tablet (750 mg total) by mouth 4 (four) times daily.        Disposition: home   Final Dx: lumbar laminectomy L4-5 for synovial cyst with posterior lateral fusion  Discharge Instructions      Remove dressing in 72 hours   Complete by: As directed    Call MD for:  difficulty breathing, headache or visual disturbances   Complete by: As directed    Call MD for:  hives   Complete by: As directed    Call MD for:  persistant dizziness or light-headedness   Complete by: As directed    Call MD for:  persistant nausea and vomiting   Complete by: As directed    Call MD for:  redness, tenderness, or signs of infection (pain, swelling, redness, odor or green/yellow discharge around incision site)   Complete by: As directed    Call MD for:  severe uncontrolled pain   Complete by: As directed    Call MD for:  temperature >100.4   Complete by: As directed    Diet - low sodium heart healthy   Complete by: As directed    Driving Restrictions   Complete by: As directed    No driving for 2 weeks, no riding in the car for 1 week   Increase activity slowly   Complete  by: As directed    Lifting restrictions   Complete by: As directed    No lifting more than 8 lbs          Signed: Tiana Loft Ameirah Khatoon 09/06/2023, 8:27 AM

## 2023-09-06 NOTE — Progress Notes (Signed)
Patient alert and oriented, void, ambulate. Surgical site clean and dry no sign of infection. D/c instructions explain and given to the patient all questions answered to the patient satisfaction.

## 2023-11-01 ENCOUNTER — Ambulatory Visit: Payer: 59 | Admitting: Podiatry

## 2023-11-21 ENCOUNTER — Ambulatory Visit: Payer: 59 | Admitting: Podiatry

## 2023-11-27 DIAGNOSIS — H40039 Anatomical narrow angle, unspecified eye: Secondary | ICD-10-CM | POA: Diagnosis not present

## 2023-11-28 ENCOUNTER — Encounter: Payer: Self-pay | Admitting: Podiatry

## 2023-11-28 ENCOUNTER — Ambulatory Visit (INDEPENDENT_AMBULATORY_CARE_PROVIDER_SITE_OTHER): Payer: 59 | Admitting: Podiatry

## 2023-11-28 DIAGNOSIS — L600 Ingrowing nail: Secondary | ICD-10-CM

## 2023-11-28 NOTE — Patient Instructions (Signed)

## 2023-11-28 NOTE — Progress Notes (Signed)
 Subjective:   Patient ID: Samantha Joseph, female   DOB: 83 y.o.   MRN: [id number]   HPI Patient presents stating she is developed a painful ingrown toenail right big toe and states it has been a hard to trim or wear shoe gear   ROS      Objective:  Physical Exam  Neurovascular status intact good digital perfusion noted incurvated medial border right big toe painful when pressed     Assessment:  Chronic ingrown toenail deformity right hallux medial border     Plan:  H&P reviewed recommended correction of deformity due to pain and patient wants this fixed.  I explained medial ingrown toenail resection with chemical and I allowed her to read signed consent form.  I infiltrated the right big toe 60 mg like Marcaine mixture sterile prep done using sterile instrumentation removed the medial border exposed matrix applied phenol 3 applications 30 seconds followed by alcohol lavage sterile dressing gave instructions on soaks wear dressing 24 hours take it off earlier if throbbing were to occur encouraged to call with questions that may come up

## 2023-11-30 DIAGNOSIS — J988 Other specified respiratory disorders: Secondary | ICD-10-CM | POA: Diagnosis not present

## 2024-01-17 ENCOUNTER — Encounter: Payer: Self-pay | Admitting: Podiatry

## 2024-01-17 ENCOUNTER — Ambulatory Visit (INDEPENDENT_AMBULATORY_CARE_PROVIDER_SITE_OTHER): Admitting: Podiatry

## 2024-01-17 ENCOUNTER — Ambulatory Visit (INDEPENDENT_AMBULATORY_CARE_PROVIDER_SITE_OTHER)

## 2024-01-17 DIAGNOSIS — M7671 Peroneal tendinitis, right leg: Secondary | ICD-10-CM

## 2024-01-17 DIAGNOSIS — M7672 Peroneal tendinitis, left leg: Secondary | ICD-10-CM

## 2024-01-17 DIAGNOSIS — M7751 Other enthesopathy of right foot: Secondary | ICD-10-CM

## 2024-01-17 MED ORDER — TRIAMCINOLONE ACETONIDE 10 MG/ML IJ SUSP
10.0000 mg | Freq: Once | INTRAMUSCULAR | Status: AC
  Administered 2024-01-17: 10 mg via INTRA_ARTICULAR

## 2024-01-20 NOTE — Progress Notes (Signed)
 Subjective:   Patient ID: Samantha Joseph, female   DOB: 83 y.o.   MRN: [id number]   HPI Patient states she has developed a lot of pain on top of both of her feet over the last 3 to 4 weeks and she is not sure if it might be arthritis   ROS      Objective:  Physical Exam  Neurovascular status intact inflammation pain of the dorsum of both feet with tendon inflammation present     Assessment:  Tendinitis dorsally with possibility for midtarsal joint arthritis or stress fracture due to history     Plan:  H&P x-rays reviewed went ahead today sterile prep injected the dorsal complex midtarsal joint tendon complex 3 mg Dexasone Kenalog  5 mg Xylocaine  advised on wider shoes reappoint to recheck  X-rays do indicate slight irritation of bone structure within this area history of stress fracture no other pathology

## 2024-02-06 ENCOUNTER — Other Ambulatory Visit: Payer: Self-pay | Admitting: Internal Medicine

## 2024-02-06 DIAGNOSIS — E7800 Pure hypercholesterolemia, unspecified: Secondary | ICD-10-CM | POA: Diagnosis not present

## 2024-02-06 DIAGNOSIS — Z0000 Encounter for general adult medical examination without abnormal findings: Secondary | ICD-10-CM | POA: Diagnosis not present

## 2024-02-06 DIAGNOSIS — M810 Age-related osteoporosis without current pathological fracture: Secondary | ICD-10-CM | POA: Diagnosis not present

## 2024-02-06 DIAGNOSIS — M8580 Other specified disorders of bone density and structure, unspecified site: Secondary | ICD-10-CM

## 2024-02-06 DIAGNOSIS — G8929 Other chronic pain: Secondary | ICD-10-CM | POA: Diagnosis not present

## 2024-04-29 ENCOUNTER — Other Ambulatory Visit: Payer: Self-pay | Admitting: Gastroenterology

## 2024-04-29 DIAGNOSIS — R1011 Right upper quadrant pain: Secondary | ICD-10-CM

## 2024-05-01 ENCOUNTER — Inpatient Hospital Stay
Admission: RE | Admit: 2024-05-01 | Discharge: 2024-05-01 | Discharge status: Home / Self Care | Source: Ambulatory Visit | Attending: Gastroenterology | Admitting: Gastroenterology

## 2024-05-01 DIAGNOSIS — R1011 Right upper quadrant pain: Secondary | ICD-10-CM

## 2024-06-26 ENCOUNTER — Other Ambulatory Visit: Payer: Self-pay | Admitting: Internal Medicine

## 2024-06-26 DIAGNOSIS — Z1231 Encounter for screening mammogram for malignant neoplasm of breast: Secondary | ICD-10-CM

## 2024-08-01 ENCOUNTER — Other Ambulatory Visit (HOSPITAL_BASED_OUTPATIENT_CLINIC_OR_DEPARTMENT_OTHER): Payer: Self-pay

## 2024-08-01 MED ORDER — FLUZONE HIGH-DOSE 0.5 ML IM SUSY
0.5000 mL | PREFILLED_SYRINGE | Freq: Once | INTRAMUSCULAR | 0 refills | Status: AC
  Filled 2024-08-01: qty 0.5, 1d supply, fill #0

## 2024-08-06 ENCOUNTER — Other Ambulatory Visit: Payer: Self-pay | Admitting: Medical Genetics

## 2024-08-11 ENCOUNTER — Ambulatory Visit

## 2024-08-13 ENCOUNTER — Ambulatory Visit
Admission: RE | Admit: 2024-08-13 | Discharge: 2024-08-13 | Disposition: A | Discharge status: Home / Self Care | Source: Ambulatory Visit | Attending: Internal Medicine | Admitting: Internal Medicine

## 2024-08-13 DIAGNOSIS — Z1231 Encounter for screening mammogram for malignant neoplasm of breast: Secondary | ICD-10-CM

## 2024-08-20 ENCOUNTER — Encounter: Payer: Self-pay | Admitting: Podiatry

## 2024-08-20 ENCOUNTER — Ambulatory Visit: Admitting: Podiatry

## 2024-08-20 ENCOUNTER — Ambulatory Visit (INDEPENDENT_AMBULATORY_CARE_PROVIDER_SITE_OTHER)

## 2024-08-20 DIAGNOSIS — M7751 Other enthesopathy of right foot: Secondary | ICD-10-CM | POA: Diagnosis not present

## 2024-08-20 DIAGNOSIS — M7672 Peroneal tendinitis, left leg: Secondary | ICD-10-CM | POA: Diagnosis not present

## 2024-08-20 DIAGNOSIS — M659 Unspecified synovitis and tenosynovitis, unspecified site: Secondary | ICD-10-CM | POA: Diagnosis not present

## 2024-08-20 DIAGNOSIS — M7752 Other enthesopathy of left foot: Secondary | ICD-10-CM | POA: Diagnosis not present

## 2024-08-20 MED ORDER — TRIAMCINOLONE ACETONIDE 10 MG/ML IJ SUSP
10.0000 mg | Freq: Once | INTRAMUSCULAR | Status: AC
  Administered 2024-08-20: 10 mg via INTRA_ARTICULAR

## 2024-08-20 NOTE — Progress Notes (Signed)
 Subjective:   Patient ID: Samantha Joseph, female   DOB: 84 y.o.   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  I did discuss possible implant arthroplasty or fusion with decision based on the response to continued treatment with medication and how long were able to make improvement  X-rays indicate significant narrowing of the joint surface first MPJ right over left

## 2024-08-20 NOTE — Patient Instructions (Signed)
Who

## 2024-10-23 ENCOUNTER — Ambulatory Visit: Admitting: Podiatry

## 2024-10-23 ENCOUNTER — Encounter: Payer: Self-pay | Admitting: Podiatry

## 2024-10-23 ENCOUNTER — Ambulatory Visit

## 2024-10-23 DIAGNOSIS — R600 Localized edema: Secondary | ICD-10-CM

## 2024-10-23 DIAGNOSIS — M84374A Stress fracture, right foot, initial encounter for fracture: Secondary | ICD-10-CM

## 2024-10-28 ENCOUNTER — Other Ambulatory Visit

## 2024-10-28 NOTE — Progress Notes (Signed)
 Subjective:   Patient ID: Samantha Joseph, female   DOB: 84 y.o.   MRN: [id number]   HPI Patient presents with intense discomfort in her left forefoot that started this morning when she woke up.  States she had trouble putting her foot down on the ground   ROS      Objective:  Physical Exam  Vascular status intact inflammation pain which appears to be focused on the fourth metatarsal distal shaft with edema associated with     Assessment:  Possibility for fracture of the metatarsal right elbow stress fracture nature     Plan:  Patient PA x-rays taken reviewed and at this point difficult to make complete determination but I am placing her in a surgical shoe to provide for rigid support and patient will be seen back as symptoms indicate and may require further x-rays or further immobilization and she does have boot at home if necessary  X-rays at this point were negative for fracture except she did have fracture of her second metatarsal shaft in the past

## 2024-10-30 ENCOUNTER — Other Ambulatory Visit (HOSPITAL_BASED_OUTPATIENT_CLINIC_OR_DEPARTMENT_OTHER): Payer: Self-pay

## 2024-10-30 ENCOUNTER — Ambulatory Visit (HOSPITAL_BASED_OUTPATIENT_CLINIC_OR_DEPARTMENT_OTHER)
Admission: RE | Admit: 2024-10-30 | Discharge: 2024-10-30 | Disposition: A | Discharge status: Home / Self Care | Source: Ambulatory Visit | Attending: Internal Medicine | Admitting: Internal Medicine

## 2024-10-30 DIAGNOSIS — M8580 Other specified disorders of bone density and structure, unspecified site: Secondary | ICD-10-CM

## 2024-10-30 DIAGNOSIS — M8589 Other specified disorders of bone density and structure, multiple sites: Secondary | ICD-10-CM | POA: Diagnosis not present

## 2024-10-30 MED ORDER — COMIRNATY 30 MCG/0.3ML IM SUSY
0.3000 mL | PREFILLED_SYRINGE | Freq: Once | INTRAMUSCULAR | 0 refills | Status: AC
  Filled 2024-10-30: qty 0.3, 1d supply, fill #0

## 2024-11-06 ENCOUNTER — Ambulatory Visit: Admitting: Podiatry

## 2024-11-10 ENCOUNTER — Ambulatory Visit: Admitting: Podiatry
